# Patient Record
Sex: Male | Born: 1960
Health system: Southern US, Community
[De-identification: ages and names within clinical notes are randomized; demographics above are authoritative.]

## PROBLEM LIST (undated history)

## (undated) DIAGNOSIS — R079 Chest pain, unspecified: Secondary | ICD-10-CM

## (undated) DIAGNOSIS — K219 Gastro-esophageal reflux disease without esophagitis: Secondary | ICD-10-CM

## (undated) DIAGNOSIS — F419 Anxiety disorder, unspecified: Secondary | ICD-10-CM

## (undated) DIAGNOSIS — M19049 Primary osteoarthritis, unspecified hand: Secondary | ICD-10-CM

## (undated) DIAGNOSIS — K5790 Diverticulosis of intestine, part unspecified, without perforation or abscess without bleeding: Secondary | ICD-10-CM

## (undated) DIAGNOSIS — R42 Dizziness and giddiness: Secondary | ICD-10-CM

## (undated) DIAGNOSIS — R5383 Other fatigue: Secondary | ICD-10-CM

## (undated) DIAGNOSIS — R319 Hematuria, unspecified: Secondary | ICD-10-CM

## (undated) DIAGNOSIS — K5792 Diverticulitis of intestine, part unspecified, without perforation or abscess without bleeding: Secondary | ICD-10-CM

## (undated) DIAGNOSIS — M1990 Unspecified osteoarthritis, unspecified site: Secondary | ICD-10-CM

## (undated) DIAGNOSIS — I4891 Unspecified atrial fibrillation: Secondary | ICD-10-CM

## (undated) DIAGNOSIS — G4700 Insomnia, unspecified: Secondary | ICD-10-CM

## (undated) HISTORY — DX: Primary osteoarthritis, unspecified hand: M19.049

## (undated) HISTORY — PX: ADENOIDECTOMY: SUR15

## (undated) HISTORY — DX: Unspecified osteoarthritis, unspecified site: M19.90

## (undated) HISTORY — DX: Unspecified atrial fibrillation: I48.91

## (undated) HISTORY — PX: OTHER SURGICAL HISTORY: SHX169

## (undated) HISTORY — DX: Anxiety disorder, unspecified: F41.9

## (undated) HISTORY — DX: Gastro-esophageal reflux disease without esophagitis: K21.9

## (undated) HISTORY — DX: Insomnia, unspecified: G47.00

## (undated) HISTORY — DX: Diverticulitis of intestine, part unspecified, without perforation or abscess without bleeding: K57.92

## (undated) HISTORY — PX: ESOPHAGEAL DILATION: SHX303

## (undated) HISTORY — DX: Chest pain, unspecified: R07.9

## (undated) HISTORY — DX: Diverticulosis of intestine, part unspecified, without perforation or abscess without bleeding: K57.90

## (undated) HISTORY — DX: Other fatigue: R53.83

## (undated) HISTORY — DX: Dizziness and giddiness: R42

## (undated) HISTORY — DX: Hematuria, unspecified: R31.9

---

## 2009-12-16 HISTORY — PX: UPPER GASTROINTESTINAL ENDOSCOPY: SHX188

## 2010-04-08 ENCOUNTER — Emergency Department (HOSPITAL_COMMUNITY): Admission: EM | Admit: 2010-04-08 | Discharge: 2010-04-08 | Payer: Self-pay | Admitting: Emergency Medicine

## 2010-04-09 ENCOUNTER — Encounter (INDEPENDENT_AMBULATORY_CARE_PROVIDER_SITE_OTHER): Payer: Self-pay | Admitting: *Deleted

## 2010-04-27 ENCOUNTER — Encounter (INDEPENDENT_AMBULATORY_CARE_PROVIDER_SITE_OTHER): Payer: Self-pay | Admitting: *Deleted

## 2010-04-27 ENCOUNTER — Ambulatory Visit: Payer: Self-pay | Admitting: Gastroenterology

## 2010-04-27 DIAGNOSIS — R1319 Other dysphagia: Secondary | ICD-10-CM

## 2010-05-11 ENCOUNTER — Ambulatory Visit: Payer: Self-pay | Admitting: Gastroenterology

## 2010-05-16 ENCOUNTER — Encounter: Payer: Self-pay | Admitting: Gastroenterology

## 2010-05-24 ENCOUNTER — Telehealth: Payer: Self-pay | Admitting: Gastroenterology

## 2010-05-29 ENCOUNTER — Encounter: Payer: Self-pay | Admitting: Gastroenterology

## 2010-06-04 ENCOUNTER — Ambulatory Visit: Payer: Self-pay | Admitting: Gastroenterology

## 2010-06-04 DIAGNOSIS — K219 Gastro-esophageal reflux disease without esophagitis: Secondary | ICD-10-CM | POA: Insufficient documentation

## 2011-01-15 ENCOUNTER — Telehealth: Payer: Self-pay | Admitting: Gastroenterology

## 2011-01-15 NOTE — Assessment & Plan Note (Signed)
Summary: esophageal spasm...em   History of Present Illness Visit Type: new patient Primary GI MD: Sheryn Bison MD FACP FAGA Chief Complaint: solid food dysphagia, worse with steak.  Pt had one episode of vomiting blood after having difficulty swallowing steak. History of Present Illness:   50 year old Caucasian male referred by the emergency room for evaluation of recurrent solid food dysphagia. Patient was seen in the emergency department on April 24 because of acute dysphagia and hematemesis. Chest x-ray was unremarkable and the patient was treated with p.o. narcotics and scheduled to see GI. He currently is asymptomatic except for continued dysphagia and a large pieces of bread or meat.  [name] has had intermittent solid food dysphagia since childhood. His most recent episode of severe chest pain with dysphagia and associated hematemesis was the worst episode he has ever had. He currently is asymptomatic. He is careful about his diet but is not losing weight. He's never had any melena, had regular complaints, other medical difficulties. He has had sinusitis this past fall was briefly on antibiotics, but had no problems with oral thrush. He denies chronic reflux, history of alcohol or cigarette or NSAID abuse. He denies a lower gastrointestinal or hepatobiliary complaints. He has never had barium swallow exams or endoscopic exams.He Denies problems with chronic allergies or recurrent steroid use. He's had no unusual weight gains over the last several years.   GI Review of Systems    Reports dysphagia with solids and  vomiting blood.      Denies abdominal pain, acid reflux, belching, bloating, chest pain, dysphagia with liquids, heartburn, loss of appetite, nausea, vomiting, weight loss, and  weight gain.        Denies anal fissure, black tarry stools, change in bowel habit, constipation, diarrhea, diverticulosis, fecal incontinence, heme positive stool, hemorrhoids, irritable bowel syndrome,  jaundice, light color stool, liver problems, rectal bleeding, and  rectal pain.    Current Medications (verified): 1)  None  Allergies (verified): No Known Drug Allergies  Past History:  Past medical, surgical, family and social histories (including risk factors) reviewed for relevance to current acute and chronic problems.  Past Medical History: Unremarkable  Past Surgical History: Reviewed history from 04/25/2010 and no changes required. Knee Arthroscopy  Family History: Reviewed history and no changes required. Family History of Breast Cancer: Mother, deceased at 73yo No FH of Colon Cancer: Family History of Heart Disease: Father  Social History: Reviewed history from 04/25/2010 and no changes required.  Married, 2 girls Field seismologist Alcohol Use - yes 1 drink/day Illicit Drug Use - no Patient has never smoked.  Daily Caffeine Use 1 cup coffee  Review of Systems       The patient complains of allergy/sinus.  The patient denies anemia, anxiety-new, arthritis/joint pain, back pain, blood in urine, breast changes/lumps, confusion, cough, coughing up blood, depression-new, fainting, fatigue, fever, headaches-new, hearing problems, heart murmur, heart rhythm changes, itching, muscle pains/cramps, night sweats, nosebleeds, shortness of breath, skin rash, sleeping problems, sore throat, swelling of feet/legs, swollen lymph glands, thirst - excessive, urination - excessive, urination changes/pain, urine leakage, vision changes, and voice change.   General:  Denies fever, chills, sweats, anorexia, fatigue, weakness, malaise, weight loss, and sleep disorder. ENT:  Complains of nasal congestion and difficulty swallowing; denies earache, ear discharge, tinnitus, decreased hearing, loss of smell, nosebleeds, sore throat, and hoarseness. CV:  Denies chest pains, angina, palpitations, syncope, dyspnea on exertion, orthopnea, PND, peripheral edema, and claudication. Resp:  Denies  dyspnea at rest, dyspnea  with exercise, cough, sputum, wheezing, coughing up blood, and pleurisy. GI:  Complains of difficulty swallowing and pain on swallowing; denies nausea, indigestion/heartburn, vomiting, vomiting blood, abdominal pain, jaundice, gas/bloating, diarrhea, constipation, change in bowel habits, bloody BM's, black BMs, and fecal incontinence. GU:  Denies urinary burning, blood in urine, urinary frequency, urinary hesitancy, nocturnal urination, urinary incontinence, penile discharge, genital sores, decreased libido, and erectile dysfunction. MS:  Denies joint pain / LOM, joint swelling, joint stiffness, joint deformity, low back pain, muscle weakness, muscle cramps, muscle atrophy, leg pain at night, leg pain with exertion, and shoulder pain / LOM hand / wrist pain (CTS). Derm:  Denies rash, itching, dry skin, hives, moles, warts, and unhealing ulcers. Neuro:  Denies weakness, paralysis, abnormal sensation, seizures, syncope, tremors, vertigo, transient blindness, frequent falls, frequent headaches, difficulty walking, headache, sciatica, radiculopathy other:, restless legs, memory loss, and confusion. Psych:  Denies depression, anxiety, memory loss, suicidal ideation, hallucinations, paranoia, phobia, and confusion. Endo:  Denies cold intolerance, heat intolerance, polydipsia, polyphagia, polyuria, unusual weight change, and hirsutism. Heme:  Denies bruising, bleeding, enlarged lymph nodes, and pagophagia. Allergy:  Complains of hay fever; denies hives, rash, sneezing, and recurrent infections.  Vital Signs:  Patient profile:   50 year old male Height:      78 inches Weight:      220 pounds BMI:     25.52 Pulse rate:   64 / minute Pulse rhythm:   regular BP sitting:   120 / 80  (left arm) Cuff size:   regular  Vitals Entered By: Francee Piccolo CMA Duncan Dull) (Apr 27, 2010 10:07 AM)  Physical Exam  General:  Well developed, well nourished, no acute distress.healthy  appearing.  Tall healthy-appearing patient measures 6 feet 6 inches in height. There no physical exam characteristics of Marfan's syndrome. Head:  Normocephalic and atraumatic. Eyes:  PERRLA, no icterus.exam deferred to patient's ophthalmologist.   Neck:  Supple; no masses or thyromegaly. Lungs:  Clear throughout to auscultation. Heart:  Regular rate and rhythm; no murmurs, rubs,  or bruits. Abdomen:  Soft, nontender and nondistended. No masses, hepatosplenomegaly or hernias noted. Normal bowel sounds. Msk:  Symmetrical with no gross deformities. Normal posture. Pulses:  Normal pulses noted. Extremities:  No clubbing, cyanosis, edema or deformities noted. Neurologic:  Alert and  oriented x4;  grossly normal neurologically. Cervical Nodes:  No significant cervical adenopathy. Psych:  Alert and cooperative. Normal mood and affect.   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 He may well have an element of chronic acid reflux, but he denies reflux symptomatology. The opposite had recent esophageal tear and severe esophagitis associated with a meat impaction. He currently is asymptomatic his physical exam is normal. He is at high risk for future meat impactions and esophageal tear and perforation. I have explained to him in detail the need for endoscopy and esophageal dilatation. He understands the risk and benefits and has agreed to proceed. In the interim, I placed him on step 3 dysphagia diet. His health otherwise is excellent and he should tolerate this procedure well. I also explained to him that if he does have a Schatzki's ring, he may need aggressive intermittent dilations.  Patient Instructions: 1)  You are scheduled for an upper endoscopy. 2)  Upper Endoscopy brochure given.  3)  The medication list was reviewed and reconciled.  All  changed /  newly prescribed medications were explained.  A complete medication list was provided to the patient / caregiver. 4)  Copy sent to : Dr R. Aava 5)  Dysphagia diet provided.  6)  Conscious Sedation brochure given.  7)  Upper Endoscopy with Dilatation brochure given.   Appended Document: esophageal spasm...em    Clinical Lists Changes  Orders: Added new Test order of EGD (EGD) - Signed

## 2011-01-15 NOTE — Miscellaneous (Signed)
Summary: Aciphex Prior Authorization   Case ID: [id number] Member Number: [id number] Case Type: Initial Review Case Start Date: 05/29/2010 Case Status: Coverage has been APPROVED. You will receive a confirmation letter confirming approval of this medication. The patient will also be notified of this approval via an automated outbound phone call or a letter. Please allow approximately 2 hours to update our system with the approval. Once updated, the prescription can be re-submitted.   Coverage Start Date: 05/08/2010 Coverage End Date: 05/28/2012  Patient First Name: Dagoberto Patient Last Name: HETZER DOB: [birth year] Patient Street Address: [address]   Patient City: [place] Patient State: [place] Patient Zip: [phone]  Drug Name & Strength: Dexilant 60 Mg  Clinical Lists Changes

## 2011-01-15 NOTE — Letter (Signed)
Summary: New Patient letter  [facility]  [address] [place], [place] [address]   Phone: [phone]  Fax: [phone]       04/09/2010 MRN: [id number]  Joseph Brennan  Dear Joseph Brennan,  Welcome to the Gastroenterology Division at [facility].    You are scheduled to see Dr.  Sheryn Bison on Apr 27, 2010 at 10:00am on the 3rd floor at [facility], [address]  We ask that you try to arrive at our office 15 minutes prior to your appointment time to allow for check-in.  We would like you to complete the enclosed self-administered evaluation form prior to your visit and bring it with you on the day of your appointment.  We will review it with you.  Also, please bring a complete list of all your medications or, if you prefer, bring the medication bottles and we will list them.  Please bring your insurance card so that we may make a copy of it.  If your insurance requires a referral to see a specialist, please bring your referral form from your primary care physician.  Co-payments are due at the time of your visit and may be paid by cash, check or credit card.     Your office visit will consist of a consult with your physician (includes a physical exam), any laboratory testing he/she may order, scheduling of any necessary diagnostic testing (e.g. x-ray, ultrasound, CT-scan), and scheduling of a procedure (e.g. Endoscopy, Colonoscopy) if required.  Please allow enough time on your schedule to allow for any/all of these possibilities.    If you cannot keep your appointment, please call [phone] to cancel or reschedule prior to your appointment date.  This allows [place] the opportunity to schedule an appointment for another patient in need of care.  If you do not cancel or reschedule by 5 p.m. the business day prior to your appointment date, you will be charged a $50.00 late cancellation/no-show fee.    Thank you for  choosing [facility] for your medical needs.  We appreciate the opportunity to care for you.  Please visit [place] at our website  to learn more about our practice.                     Sincerely,                                                             The Gastroenterology Division

## 2011-01-15 NOTE — Medication Information (Signed)
Summary: Dexilant Approved/[employer]  Dexilant Approved/[employer]   Imported By: Sherian Rein 05/31/2010 14:00:16  _____________________________________________________________________  External Attachment:    Type:   Image     Comment:   External Document

## 2011-01-15 NOTE — Progress Notes (Signed)
Summary: Dexilant  Phone Note Call from Patient Call back at [facility] Phone [phone]   Caller: Patient Call For: Dr. Jarold Motto Reason for Call: Refill Medication Summary of Call: would like a refill of Dexilant... [facility] [place] Initial call taken by: Vallarie Mare,  May 24, 2010 2:23 PM  Follow-up for Phone Call        Rx sent as requested. Follow-up by: Ashok Cordia RN,  May 24, 2010 2:31 PM    New/Updated Medications: DEXILANT 60 MG CPDR (DEXLANSOPRAZOLE) 1 by mouth q am Prescriptions: DEXILANT 60 MG CPDR (DEXLANSOPRAZOLE) 1 by mouth q am  #30 x 11   Entered by:   Ashok Cordia RN   Authorized by:   Mardella Layman MD [facility]   Signed by:   Ashok Cordia RN on 05/24/2010   Method used:   [facility]  [place] [address] (retail)       [address]       [place], [place]  [address]       Ph: [phone] or [phone]       Fax: [phone]   RxID:   [id number]

## 2011-01-15 NOTE — Letter (Signed)
Summary: Patient Notice-Endo Biopsy Results  [facility]  [address] [place], [place] [address]   Phone: [phone]  Fax: [phone]        May 16, 2010 MRN: [id number]    Joseph Brennan [address] [place], [place]  [address]    Dear [name],  I am pleased to inform you that the biopsies taken during your recent endoscopic examination did not show any evidence of cancer upon pathologic examination.  Additional information/recommendations:  __No further action is needed at this time.  Please follow-up with      your primary care physician for your other healthcare needs.  __ Please call [phone] to schedule a return visit to review      your condition.  _x_ Continue with the treatment plan as outlined on the day of your      exam.  __ You should have a repeat endoscopic examination for this problem              in _ months/years.   Please call us if you are having persistent problems or have questions about your condition that have not been fully answered at this time.  Sincerely,  Mardella Layman MD [facility]  This letter has been electronically signed by your physician.  Appended Document: Patient Notice-Endo Biopsy Results letter mailed.

## 2011-01-15 NOTE — Letter (Signed)
Summary: EGD Instructions  [facility]  [address] [place], [place] [address]   Phone: [phone]  Fax: [phone]       Joseph Brennan    13-Jun-1961    MRN: [id number]       Procedure Day /Date: Friday, 05/11/10     Arrival Time:  1:30     Procedure Time: 2:30     [facility] of Procedure:                    [name]th [facility]    PREPARATION FOR ENDOSCOPY   On 05/11/10 THE DAY OF THE PROCEDURE:  1.   No solid foods, milk or milk products are allowed after midnight the night before your procedure.  2.   Do not drink anything colored red or purple.  Avoid juices with pulp.  No orange juice.  3.  You may drink clear liquids until 12:30, which is 2 hours before your procedure.                                                                                                CLEAR LIQUIDS INCLUDE: Water Jello Ice Popsicles Tea (sugar ok, no milk/cream) Powdered fruit flavored drinks Coffee (sugar ok, no milk/cream) Gatorade Juice: apple, white grape, white cranberry  Lemonade Clear bullion, consomm, broth Carbonated beverages (any kind) Strained chicken noodle soup Hard Candy   MEDICATION INSTRUCTIONS  Unless otherwise instructed, you should take regular prescription medications with a small sip of water as early as possible the morning of your procedure.                    OTHER INSTRUCTIONS  You will need a responsible adult at least 48 years of age to accompany you and drive you home.   This person must remain in the waiting room during your procedure.  Wear loose fitting clothing that is easily removed.  Leave jewelry and other valuables at home.  However, you may wish to bring a book to read or an iPod/MP3 player to listen to music as you wait for your procedure to start.  Remove all body piercing jewelry and leave at home.  Total time from sign-in until discharge is approximately 2-3 hours.  You should go  home directly after your procedure and rest.  You can resume normal activities the day after your procedure.  The day of your procedure you should not:   Drive   Make legal decisions   Operate machinery   Drink alcohol   Return to work  You will receive specific instructions about eating, activities and medications before you leave.    The above instructions have been reviewed and explained to me by   _______________________    I fully understand and can verbalize these instructions _____________________________ Date _________

## 2011-01-15 NOTE — Procedures (Signed)
Summary: Upper Endoscopy  Patient: Joseph Brennan Note: All result statuses are Final unless otherwise noted.  Tests: (1) Upper Endoscopy (EGD)   EGD Upper Endoscopy       DONE     [facility]     [address]     [place], [place]  [address]           ENDOSCOPY PROCEDURE REPORT           PATIENT:  Joseph Brennan, Joseph Brennan  MR#:  [id number]     BIRTHDATE:  05-01-1961, 48 yrs. old  GENDER:  male           ENDOSCOPIST:  Vania Rea. Jarold Motto, MD, [facility]     Referred by:           PROCEDURE DATE:  05/11/2010     PROCEDURE:  EGD with biopsy     ASA CLASS:  Class I     INDICATIONS:  dysphagia           MEDICATIONS:   Fentanyl 75 mcg IV, Versed 7 mg IV     TOPICAL ANESTHETIC:  Exactacain Spray           DESCRIPTION OF PROCEDURE:   After the risks benefits and     alternatives of the procedure were thoroughly explained, informed     consent was obtained.  The [facility] [id number] endoscope was     introduced through the mouth and advanced to the second portion of     the duodenum, without limitations.  The instrument was slowly     withdrawn as the mucosa was fully examined.     <<PROCEDUREIMAGES>>           A stricture was found at the gastroesophageal junction. EROSIONS     AND TIGHT STRICTURE DILATED WITH THE SCOPE.BIOPSIES DONE.  The     duodenal bulb was normal in appearance, as was the postbulbar     duodenum.  The stomach was entered and closely examined. The     antrum, angularis, and lesser curvature were well visualized,     including a retroflexed view of the cardia and fundus. The stomach     wall was normally distensable. The scope passed easily through the     pylorus into the duodenum.    SOME HEME NOTED.  The scope was then     withdrawn from the patient and the procedure completed.           COMPLICATIONS:  None           ENDOSCOPIC IMPRESSION:     1) Stricture at the gastroesophageal junction     2) Normal duodenum     3) Normal stomach     CHRONIC GERD AND PEPTIC  STRICTURE DILATED WITH ENDOSCOPE.R/O     EOSINOPHILIC ESOPHAGITIS.     RECOMMENDATIONS:     1) Await biopsy results     2) Clear liquids until, then soft foods rest iof day. Resume     prior diet tomorrow.     3) dilatations PRN     4) post dilation instructions     DEXILANT SAMPLES QAM.SEE ME 2 WEEKS.           REPEAT EXAM:  No           ______________________________     Vania Rea. Jarold Motto, MD, [facility]           CC:  Chilton Greathouse, MD  n.     eSIGNED:   Vania Rea. Patterson at 05/11/2010 02:42 PM           Nevin Bloodgood, [phone]  Note: An exclamation mark (!) indicates a result that was not dispersed into the flowsheet. Document Creation Date: 05/11/2010 2:43 PM _______________________________________________________________________  (1) Order result status: Final Collection or observation date-time: 05/11/2010 14:33 Requested date-time:  Receipt date-time:  Reported date-time:  Referring Physician:   Ordering Physician: Sheryn Bison [phone]) Specimen Source:  Source: Launa Grill Order Number: [id number] Lab site:   Appended Document: Upper Endoscopy noted

## 2011-01-15 NOTE — Assessment & Plan Note (Signed)
Summary: EGD F/U.Marland KitchenMarland KitchenAS.   History of Present Illness Visit Type: Follow-up Visit Primary GI MD: Sheryn Bison MD FACP FAGA Primary Provider: Lilli Few, MD Chief Complaint: No dysphagia or espohageal spasms History of Present Illness:   The Patient is 100% better on daily PPI therapy. In one endoscopy with esophageal dilatation on May 27. There was severe esophagitis with tight stricture that was dilated with the endoscope. Biopsies showed inflammation but no intestinal metaplasia. He does have allergies but is not on allergy shots her immunosuppressants.   GI Review of Systems      Denies abdominal pain, acid reflux, belching, bloating, chest pain, dysphagia with liquids, dysphagia with solids, heartburn, loss of appetite, nausea, vomiting, vomiting blood, weight loss, and  weight gain.        Denies anal fissure, black tarry stools, change in bowel habit, constipation, diarrhea, diverticulosis, fecal incontinence, heme positive stool, hemorrhoids, irritable bowel syndrome, jaundice, light color stool, liver problems, rectal bleeding, and  rectal pain.    Current Medications (verified): 1)  Dexilant 60 Mg Cpdr (Dexlansoprazole) .Marland Kitchen.. 1 By Mouth Q Am 2)  Multivitamins  Tabs (Multiple Vitamin) .... Once Daily  Allergies (verified): No Known Drug Allergies  Past History:  Past medical, surgical, family and social histories (including risk factors) reviewed for relevance to current acute and chronic problems.  Past Medical History: Reviewed history from 04/27/2010 and no changes required. Unremarkable  Past Surgical History: Knee Arthr0scopy Adnoidecotmy  Family History: Reviewed history from 04/27/2010 and no changes required. Family History of Breast Cancer: Mother, deceased at 47yo No FH of Colon Cancer: Family History of Heart Disease: Father  Social History: Reviewed history from 04/27/2010 and no changes required.  Married, 2 girls Field seismologist Alcohol Use -  yes 1 drink/day Illicit Drug Use - no Patient has never smoked.  Daily Caffeine Use 1 cup coffee  Review of Systems       The patient complains of arthritis/joint pain and back pain.  The patient denies allergy/sinus, anemia, anxiety-new, blood in urine, breast changes/lumps, change in vision, confusion, cough, coughing up blood, depression-new, fainting, fatigue, fever, headaches-new, hearing problems, heart murmur, heart rhythm changes, itching, menstrual pain, muscle pains/cramps, night sweats, nosebleeds, pregnancy symptoms, shortness of breath, skin rash, sleeping problems, sore throat, swelling of feet/legs, swollen lymph glands, thirst - excessive, urination - excessive, urination changes/pain, urine leakage, vision changes, and voice change.         He has had some arthralgias and stiffness in his hands but no other allergic symptomatology, nausea, abdominal pain, or diarrhea from PPI therapy.  Vital Signs:  Patient profile:   50 year old male Height:      78 inches Weight:      222.38 pounds BMI:     25.79 Pulse rate:   68 / minute Pulse rhythm:   regular BP sitting:   110 / 86  (left arm) Cuff size:   regular  Vitals Entered By: June McMurray CMA Duncan Dull) (June 04, 2010 11:32 AM)  Physical Exam  General:  Well developed, well nourished, no acute distress.healthy appearing.  healthy appearing.   Head:  Normocephalic and atraumatic. Eyes:  PERRLA, no icterus.exam deferred to patient's ophthalmologist.  exam deferred to patient's ophthalmologist.   Psych:  Alert and cooperative. Normal mood and affect.   Impression & Recommendations:  Problem # 1:  DYSPHAGIA (ICD-787.29) Assessment Improved Continue daily PPI therapy---will change to Nexium 40 mg a day per his insurance plan. Anti-reflux maneuvers reviewed, and I  will see him again in 6 months time. He may need further dilations depending on his symptomatology. We reviewed the class of medications and multiple PPI profiles,  and actually is questioned at length. He seemed very satisfied with his care, and is able to swallow things that he never good in the past. He has recurrent problems we will consider esophageal manometry and 24-hour pH probe testing.  Problem # 2:  GERD (ICD-530.81) Assessment: Improved  Patient Instructions: 1)  Stop Dexilant. 2)  Begin Nexium 40 mg once daily. 3)  Please schedule a follow-up appointment in 6 months. 4)  The medication list was reviewed and reconciled.  All changed / newly prescribed medications were explained.  A complete medication list was provided to the patient / caregiver. 5)  Please schedule a follow-up appointment in 6 months. 6)  Please schedule a follow-up appointment as needed.  7)  Avoid foods high in acid content ( tomatoes, citrus juices, spicy foods) . Avoid eating within 3 to 4 hours of lying down or before exercising. Do not over eat; try smaller more frequent meals. Elevate head of bed four inches when sleeping.  8)  Copy sent to : Dr. Dennison Mascot internal medicine. Prescriptions: NEXIUM 40 MG  CPDR (ESOMEPRAZOLE MAGNESIUM) 1 capsule each day 30 minutes before meal  #30 x 11   Entered by:   Ashok Cordia RN   Authorized by:   Mardella Layman MD [facility]   Signed by:   Ashok Cordia RN on 06/04/2010   Method used:   Print then Give to Patient   RxID:   [id number]

## 2011-01-23 NOTE — Progress Notes (Signed)
Summary: Medication  Phone Note Call from Patient Call back at Home Phone [phone]   Caller: Patient Call For: Dr. Jarold Motto Reason for Call: Refill Medication Summary of Call: Needs a refill on his Dexilant fax to [facility] [phone] Initial call taken by: Karna Christmas,  January 15, 2011 3:56 PM  Follow-up for Phone Call        sent to [facility]. Follow-up by: Harlow Mares CMA (AAMA),  January 15, 2011 4:02 PM    Prescriptions: NEXIUM 40 MG  CPDR (ESOMEPRAZOLE MAGNESIUM) 1 capsule each day 30 minutes before meal  #90 x 3   Entered by:   Harlow Mares CMA (AAMA)   Authorized by:   Mardella Layman MD [facility]   Signed by:   Harlow Mares CMA (AAMA) on 01/15/2011   Method used:   Electronically to        [facility]* (retail)             ,          Ph: [phone]       Fax: [phone]   RxID:   [id number]

## 2011-12-05 ENCOUNTER — Other Ambulatory Visit: Payer: Self-pay | Admitting: Gastroenterology

## 2012-04-15 ENCOUNTER — Ambulatory Visit: Payer: BC Managed Care – PPO

## 2012-04-15 ENCOUNTER — Ambulatory Visit (INDEPENDENT_AMBULATORY_CARE_PROVIDER_SITE_OTHER): Payer: BC Managed Care – PPO | Admitting: Family Medicine

## 2012-04-15 VITALS — BP 118/78 | HR 62 | Temp 98.1°F | Resp 16 | Ht 78.5 in | Wt 235.0 lb

## 2012-04-15 DIAGNOSIS — S63006A Unspecified dislocation of unspecified wrist and hand, initial encounter: Secondary | ICD-10-CM

## 2012-04-15 DIAGNOSIS — S61219A Laceration without foreign body of unspecified finger without damage to nail, initial encounter: Secondary | ICD-10-CM

## 2012-04-15 DIAGNOSIS — M25539 Pain in unspecified wrist: Secondary | ICD-10-CM

## 2012-04-15 DIAGNOSIS — Z23 Encounter for immunization: Secondary | ICD-10-CM

## 2012-04-15 MED ORDER — TRAMADOL HCL 50 MG PO TABS: 50.0000 mg | ORAL_TABLET | Freq: Three times a day (TID) | ORAL | Status: AC | PRN

## 2012-04-15 MED ORDER — TETANUS-DIPHTH-ACELL PERTUSSIS 5-2.5-18.5 LF-MCG/0.5 IM SUSP
0.5000 mL | Freq: Once | INTRAMUSCULAR | Status: AC
  Administered 2012-04-15: 0.5 mL via INTRAMUSCULAR

## 2012-04-15 NOTE — Progress Notes (Signed)
  [facility]:  Office Visit  Chief Complaint:  Chief Complaint  Patient presents with  . finger cut    right pinky cut with knife around 11 lastnight    HPI: Joseph Brennan is a 51 y.o. male who complains of  Right 5th finger cut s/p cleaning knife. Patient is a Engineer, civil (consulting) and was cleaning a large non-serrated Japanese knife when it slid and cut his finger. Not UTD on tetanus. No fevers, chills, redness, pus.  No tendon exposure. He is a Firefighter. He did not take anything for pain. He is not on any blood thinners. He is left handed.Deneis weakness, numbness, or tingling.    Past Medical History  Diagnosis Date  . GERD (gastroesophageal reflux disease)    Past Surgical History  Procedure Date  . Esophageal dilation   . Right knee arthroscopy     Dr. Rennis Chris for mensicus injury   History   Social History  . Marital Status: Married    Spouse Name: N/A    Number of Children: N/A  . Years of Education: N/A   Social History Main Topics  . Smoking status: Never Smoker   . Smokeless tobacco: None  . Alcohol Use: 0.0 oz/week     social  . Drug Use: No  . Sexually Active: None   Other Topics Concern  . None   Social History Narrative  . None   No family history on file. No Known Allergies Prior to Admission medications   Medication Sig Start Date End Date Taking? Authorizing Provider  omeprazole (PRILOSEC) 20 MG capsule Take 20 mg by mouth daily.   Yes Historical Provider, MD     ROS: The patient denies fevers, chills, night sweats, unintentional weight loss, chest pain, palpitations, wheezing, dyspnea on exertion, nausea, vomiting, abdominal pain, dysuria, hematuria, melena, numbness, weakness, or tingling.   All other systems have been reviewed and were otherwise negative with the exception of those mentioned in the HPI and as above.    PHYSICAL EXAM: Filed Vitals:   04/15/12 1030  BP: 118/78  Pulse: 62  Temp: 98.1 F (36.7 C)    Resp: 16   Filed Vitals:   04/15/12 1030  Height: 6' 6.5" (1.994 m)  Weight: 235 lb (106.595 kg)   Body mass index is 26.81 kg/(m^2).  General: Alert, no acute distress HEENT:  Normocephalic, atraumatic, oropharynx patent.  Cardiovascular:  Regular rate and rhythm, no rubs murmurs or gallops.  No Carotid bruits, radial pulse intact. No pedal edema.  Respiratory: Clear to auscultation bilaterally.  No wheezes, rales, or rhonchi.  No cyanosis, no use of accessory musculature GI: No organomegaly, abdomen is soft and non-tender, positive bowel sounds.  No masses. Skin: No rashes. Neurologic: Facial musculature symmetric. Psychiatric: Patient is appropriate throughout our interaction. Lymphatic: No cervical lymphadenopathy Musculoskeletal: Gait intact. Right Hand: No swelling, + tender 1 cm laceration ( horizontal ) + DIP,PIP intact + radial pulse + cap refill + neurovascularly intact 5/5 strength Sensation intact   LABS: No results found for this or any previous visit.   EKG/XRAY:   Primary read interpreted by Dr. Conley Rolls at [facility]. No foreign body No fx/dislocation   ASSESSMENT/PLAN: Encounter Diagnosis  Name Primary?  . Finger laceration Yes   1. Stitches 2. Wound care as directed 3. Tramadol rx prn pain    Decarla Siemen PHUONG, DO 04/15/2012 10:52 AM

## 2012-04-15 NOTE — Progress Notes (Signed)
  Subjective:    Patient ID: Joseph Brennan, male    DOB: 04-18-1961, 51 y.o.   MRN: [id number]  HPI    Review of Systems     Objective:   Physical Exam   Procedure Note:  VCO.  Digital block placed in left 5th digit finger.  Wound soaked in soapy water and thoroughly cleansed to remove superficial debris.  #4 simple interrupted sutures place to close wound.  Fingertip and coban dressing applied.  Pt tolerated well.     Assessment & Plan:  Left 5th fingertip laceration: Tdap updated today.  Wound instructions given with pamphlet.  RTC in 7 days for suture removal.

## 2012-04-21 ENCOUNTER — Telehealth: Payer: Self-pay

## 2012-04-21 ENCOUNTER — Ambulatory Visit (INDEPENDENT_AMBULATORY_CARE_PROVIDER_SITE_OTHER): Payer: BC Managed Care – PPO | Admitting: Physician Assistant

## 2012-04-21 DIAGNOSIS — Z4802 Encounter for removal of sutures: Secondary | ICD-10-CM

## 2012-04-21 DIAGNOSIS — L089 Local infection of the skin and subcutaneous tissue, unspecified: Secondary | ICD-10-CM

## 2012-04-21 DIAGNOSIS — T148XXA Other injury of unspecified body region, initial encounter: Secondary | ICD-10-CM

## 2012-04-21 MED ORDER — DOXYCYCLINE HYCLATE 100 MG PO CAPS: 100.0000 mg | ORAL_CAPSULE | Freq: Two times a day (BID) | ORAL | Status: AC

## 2012-04-21 NOTE — Progress Notes (Signed)
   Patient ID: Joseph Brennan MRN: [id number], DOB: 1961/03/31 51 y.o. Date of Encounter: 04/21/2012, 11:14 AM  Primary Physician: No primary provider on file.  Chief Complaint: Suture removal    See note from 04/15/12   HPI: 51 y.o. y/o male with injury to right 5th digit. Here for suture removal s/p placement on 04/15/12. Doing well Noticed mild erythema and drainage 2 days prior after he hit the wound on a counter top. Drainage has been mostly clear Afebrile/ No chills No pain Able to move without difficulty Normal sensation  Past Medical History  Diagnosis Date  . GERD (gastroesophageal reflux disease)      Home Meds: Prior to Admission medications   Medication Sig Start Date End Date Taking? Authorizing Provider  omeprazole (PRILOSEC) 20 MG capsule Take 20 mg by mouth daily.   Yes Historical Provider, MD  traMADol (ULTRAM) 50 MG tablet Take 1 tablet (50 mg total) by mouth every 8 (eight) hours as needed for pain. 04/15/12 04/25/12  Thao P Le, DO    Allergies: No Known Allergies  Physical Exam: Blood pressure 135/83, pulse 60, temperature 97.6 F (36.4 C), temperature source Oral, resp. rate 18, height 6\' 6"  (1.981 m), weight 234 lb (106.142 kg)., Body mass index is 27.04 kg/(m^2). General: Well developed, well nourished, in no acute distress. Head: Normocephalic, atraumatic, sclera non-icteric, no xanthomas, nares are without discharge.  Neck: Supple. Lungs: Breathing is unlabored. Heart: Normal rate. Msk:  Strength and tone appear normal for age. Wound: Right 5th digit wound slightly macerated tissue with some local STS and erythema. No purulent discharge. Wound edges are approximated. No tenderness to palpation. FROM and 5/5 strength with normal sensation throughout including 2 point discrimination Skin: See above, otherwise dry without rash. Extremities: No clubbing or cyanosis. No edema. Neuro: Alert and oriented X 3. Moves all extremities spontaneously.  Psych:   Responds to questions appropriately with a normal affect.   PROCEDURE: Verbal consent obtained. 4 sutures removed without difficulty.  Assessment and Plan: 51 y.o. y/o male here for suture removal for wound described above and wound infection. -Sutures removed per above -Doxycycline 100 mg 1 po bid 320 no RF -RTC precautions -RTC prn  Signed, Eula Listen, PA-C 04/21/2012 11:14 AM

## 2012-04-21 NOTE — Telephone Encounter (Signed)
Spoke with patient advised him to return to clinic to recheck stitches and infection patient agrees

## 2012-04-21 NOTE — Telephone Encounter (Signed)
.  umfc The patient called to request antibiotic be called in to [facility] because he believes his finger laceration has become infected.  Please call patient at [phone].

## 2013-09-03 ENCOUNTER — Ambulatory Visit: Payer: BC Managed Care – PPO | Admitting: Internal Medicine

## 2013-09-03 ENCOUNTER — Telehealth: Payer: Self-pay

## 2013-09-03 VITALS — BP 126/80 | HR 64 | Temp 98.5°F | Resp 16 | Ht 78.0 in | Wt 242.0 lb

## 2013-09-03 DIAGNOSIS — S81009A Unspecified open wound, unspecified knee, initial encounter: Secondary | ICD-10-CM

## 2013-09-03 DIAGNOSIS — M79605 Pain in left leg: Secondary | ICD-10-CM

## 2013-09-03 DIAGNOSIS — M79609 Pain in unspecified limb: Secondary | ICD-10-CM

## 2013-09-03 DIAGNOSIS — T148XXA Other injury of unspecified body region, initial encounter: Secondary | ICD-10-CM

## 2013-09-03 DIAGNOSIS — S81802A Unspecified open wound, left lower leg, initial encounter: Secondary | ICD-10-CM

## 2013-09-03 DIAGNOSIS — W540XXA Bitten by dog, initial encounter: Secondary | ICD-10-CM

## 2013-09-03 MED ORDER — AMOXICILLIN-POT CLAVULANATE 875-125 MG PO TABS: 1.0000 | ORAL_TABLET | Freq: Two times a day (BID) | ORAL | Status: DC

## 2013-09-03 NOTE — Telephone Encounter (Signed)
Patient was seen today (09/03/2013) for a dog bite. States that he is supposed to get a series of shots to follow up after being treated for the bite. However, he needs to know where he could go to have this done. Patient has already called the [facility] and they directed him to go to the ER. Patient would like another option. [phone].

## 2013-09-03 NOTE — Progress Notes (Signed)
  Subjective:    Patient ID: Joseph Brennan, male    DOB: 15-Sep-1961, 52 y.o.   MRN: [id number]  HPI On [address] and neighbors dog bit his leg. Minimal information given. Has wound on left calf, utd on Td. Wound needs cleaning and repair. Animal control notified by [facility].   Review of Systems healthy    Objective:   Physical Exam  Vitals reviewed. Constitutional: He is oriented to person, place, and time. He appears well-developed and well-nourished. No distress.  HENT:  Head: Normocephalic.  Eyes: EOM are normal.  Pulmonary/Chest: Effort normal.  Musculoskeletal: Normal range of motion. He exhibits tenderness.  Neurological: He is alert and oriented to person, place, and time. He has normal reflexes. No cranial nerve deficit. Coordination normal.  Skin: Abrasion, bruising, ecchymosis and laceration noted. There is erythema.     Dog bite wound  Psychiatric: He has a normal mood and affect.   Ms Leotis Shames to clean and repair wound       Assessment & Plan:  Animal control to investigate bite and animal

## 2013-09-03 NOTE — Progress Notes (Signed)
  Subjective:    Patient ID: Joseph Brennan, male    DOB: 1961/03/08, 52 y.o.   MRN: [id number]  HPI pt here c/o of a dog bite on left calf x today, now pain bleeding under control.     Review of Systems     Objective:   Physical Exam        Assessment & Plan:

## 2013-09-03 NOTE — Patient Instructions (Addendum)
Rabies  Rabies is a viral infection that can be spread to people from infected animals. The infection affects the brain and central nervous system. Once the disease develops, it almost always causes death. Because of this, when a person is bitten by an animal that may have rabies, treatment to prevent rabies often needs to be started whether or not the animal is known to be infected. Prompt treatment with the rabies vaccine and rabies immune globulin is very effective at preventing the infection from developing in people who have been exposed to the rabies virus. CAUSES  Rabies is caused by a virus that lives inside some animals. When a person is bitten by an infected animal, the rabies virus is spread to the person through the infected spit (saliva) of the animal. This virus can be carried by animals such as dogs, cats, skunks, bats, woodchucks, raccoons, coyotes, and foxes. SYMPTOMS  By the time symptoms appear, rabies is usually fatal for the person. Common symptoms include:  Headache.  Fever.  Fatigue and weakness.  Agitation.  Anxiety.  Confusion.  Unusual behavior, such as hyperactivity, fear of water (hydrophobia), or fear of air (aerophobia).  Hallucinations.  Insomnia.  Weakness in the arms or legs.  Difficulty swallowing. Most people get sick in 1 3 months after being bitten. This often varies and may depend on the location of the bite. The infection will take less time to develop if the bite occurred closer to the head.  DIAGNOSIS  To determine if a person is infected, several tests must be performed, such as:  A skin biopsy.  A saliva test.  A lumbar puncture to remove spinal fluid so it can be examined.  Blood tests. TREATMENT  Treatment to prevent the infection from developing (post-exposure prophylaxis, PEP) is often started before knowing for sure if the person has been exposed to the rabies virus. PEP involves cleaning the wound, giving an antibody injection  (rabies immune globulin), and giving a series of rabies vaccine injections. The series of injections are usually given over a two-week period. If possible, the animal that bit the person will be observed to see if it remains healthy. If the animal has been killed, it can be sent to a state laboratory and examined to see if the animal had rabies. If a person is bitten by a domestic animal (dog, cat, or ferret) that appears healthy and can be observed to see if it remains healthy, often no further treatment is necessary other than care of the wounds caused by the animal. Rabies is often a fatal illness once the infection develops in a person. Although a few people who developed rabies have survived after experimental treatment with certain drugs, all these survivors still had severe nervous system problems after the treatment. This is why caregivers use extra caution and begin PEP treatment for people who have been bitten by animals that are possibly infected with rabies.  HOME CARE INSTRUCTIONS  If you were bitten by an unknown animal, make sure you know your caregiver's instructions for follow-up. If the animal was sent to a laboratory for examination, ask when the test results will be ready. Make sure you get the test results.  Take these steps to care for your wound:  Keep the wound clean, dry, and dressed as directed by your caregiver.  Keep the injured part elevated as much as possible.  Do not resume use of the affected area until directed.  Only take over-the-counter or prescription medicines as directed   by your caregiver.  Keep all follow-up appointments as directed by your caregiver. PREVENTION  To prevent rabies, people need to reduce their risk of having contact with infected animals.   Make sure your pets (dogs, cats, ferrets) are vaccinated against rabies. Keep these vaccinations up-to-date as directed by your veterinarian.  Supervise your pets when they are outside. Keep them away  from wild animals.  Call your local animal control services to report any stray animals. These animals may not be vaccinated.  Stay away from stray or wild animals.  Consider getting the rabies vaccine (preexposure) if you are traveling to an area where rabies is common or if your job or activities involve possible contact with wild or stray animals. Discuss this with your caregiver. Document Released: 12/02/2005 Document Revised: 08/26/2012 Document Reviewed: 06/30/2012 Exit[employer] Patient Information 2014 Exit[employer], [place].   WOUND CARE Please return in 7-10 days to have your stitches/staples removed or sooner if you have concerns. Marland Kitchen Keep area clean and dry for 24 hours. Do not remove bandage, if applied. . After 24 hours, remove bandage and wash wound gently with mild soap and warm water. Reapply a new bandage after cleaning wound, if directed. . Continue daily cleansing with soap and water until stitches/staples are removed. . Do not apply any ointments or creams to the wound while stitches/staples are in place, as this may cause delayed healing. . Notify the office if you experience any of the following signs of infection: Swelling, redness, pus drainage, streaking, fever >101.0 F . Notify the office if you experience excessive bleeding that does not stop after 15-20 minutes of constant, firm pressure.

## 2013-09-03 NOTE — Telephone Encounter (Signed)
Spoke with pt advised that the [facility] ED is the only place to receive the rabies shots and immunoglobin. Pt understood.

## 2013-09-03 NOTE — Progress Notes (Signed)
Verbal consent obtained from the patient.  Local anesthesia with 2cc Lidocaine 2% without epinephrine.  Wound scrubbed with soap and water and rinsed.  Wound closed with #2 4-0 Prolene (#1 HM, #1 SI) sutures.  Wound cleansed and dressed.

## 2013-09-06 ENCOUNTER — Emergency Department (HOSPITAL_COMMUNITY)
Admission: EM | Admit: 2013-09-06 | Discharge: 2013-09-06 | Disposition: A | Discharge status: Home / Self Care | Payer: BC Managed Care – PPO | Attending: Emergency Medicine | Admitting: Emergency Medicine

## 2013-09-06 ENCOUNTER — Encounter (HOSPITAL_COMMUNITY): Payer: Self-pay | Admitting: *Deleted

## 2013-09-06 DIAGNOSIS — Z792 Long term (current) use of antibiotics: Secondary | ICD-10-CM | POA: Insufficient documentation

## 2013-09-06 DIAGNOSIS — Z79899 Other long term (current) drug therapy: Secondary | ICD-10-CM | POA: Insufficient documentation

## 2013-09-06 DIAGNOSIS — Z203 Contact with and (suspected) exposure to rabies: Secondary | ICD-10-CM | POA: Insufficient documentation

## 2013-09-06 DIAGNOSIS — Z23 Encounter for immunization: Secondary | ICD-10-CM | POA: Insufficient documentation

## 2013-09-06 DIAGNOSIS — K219 Gastro-esophageal reflux disease without esophagitis: Secondary | ICD-10-CM | POA: Insufficient documentation

## 2013-09-06 MED ORDER — IBUPROFEN 400 MG PO TABS
800.0000 mg | ORAL_TABLET | Freq: Once | ORAL | Status: AC
  Administered 2013-09-06: 800 mg via ORAL
  Filled 2013-09-06: qty 2

## 2013-09-06 MED ORDER — RABIES VACCINE, PCEC IM SUSR
1.0000 mL | Freq: Once | INTRAMUSCULAR | Status: AC
  Administered 2013-09-06: 1 mL via INTRAMUSCULAR
  Filled 2013-09-06: qty 1

## 2013-09-06 MED ORDER — RABIES IMMUNE GLOBULIN 150 UNIT/ML IM INJ
2100.0000 [IU] | INJECTION | Freq: Once | INTRAMUSCULAR | Status: AC
  Administered 2013-09-06: 2100 [IU] via INTRAMUSCULAR
  Filled 2013-09-06: qty 14

## 2013-09-06 NOTE — ED Notes (Signed)
Bitten on left lower leg by dog on [address] dog not on leash saw dr on Friday and wound was sutured was sent for rabies shots due to unkn shot record of dog and person cannot be found

## 2013-09-06 NOTE — ED Notes (Addendum)
Pt was bitten by a dog Friday and is unable to confirm if dog has had rabies shot.  Was told to come here for rabies vaccine.  Wound is dry and intact.  Pt is on antibiotics.

## 2013-09-06 NOTE — ED Provider Notes (Signed)
CSN: [id number]     Arrival date & time 09/06/13  1026 History  This chart was scribed for Jaynie Crumble, PA, working with Candyce Churn, MD by Blanchard Kelch, ED Scribe. This patient was seen in room TR06C/TR06C and the patient's care was started at 10:59 AM.     Chief Complaint  Patient presents with  . needs rabies vaccine     The history is provided by the patient. No language interpreter was used.    HPI Comments: Joseph Brennan is a 52 y.o. male who presents to the Emergency Department for a rabies vaccination due to a dog bite on his left lower leg that occured three days ago. The patient reports the wound was draining yesterday. He was seen by Dr. Perrin Maltese after the bite who stitched up the wound and prescribed Augmentin.  He denies knowing the dog owner or vaccination record. He denies fever or chills.  Past Medical History  Diagnosis Date  . GERD (gastroesophageal reflux disease)    Past Surgical History  Procedure Laterality Date  . Esophageal dilation    . Right knee arthroscopy      Dr. Rennis Chris for mensicus injury   No family history on file. History  Substance Use Topics  . Smoking status: Never Smoker   . Smokeless tobacco: Not on file  . Alcohol Use: 0.0 oz/week     Comment: social    Review of Systems  Constitutional: Negative for fever and chills.  Skin: Positive for wound.  All other systems reviewed and are negative.    Allergies  Review of patient's allergies indicates no known allergies.  Home Medications   Current Outpatient Rx  Name  Route  Sig  Dispense  Refill  . amoxicillin-clavulanate (AUGMENTIN) 875-125 MG per tablet   Oral   Take 1 tablet by mouth 2 (two) times daily.   20 tablet   0   . esomeprazole (NEXIUM) 20 MG capsule   Oral   Take 20 mg by mouth daily before breakfast.          Triage Vitals: BP 141/95  Pulse 74  Temp(Src) 97.8 F (36.6 C) (Oral)  Resp 18  Ht 6\' 6"  (1.981 m)  Wt 240 lb (108.863 kg)  BMI 27.74  kg/m2  SpO2 97%  Physical Exam  Nursing note and vitals reviewed. Constitutional: He is oriented to person, place, and time. He appears well-developed and well-nourished. No distress.  HENT:  Head: Normocephalic and atraumatic.  Eyes: EOM are normal.  Neck: Neck supple. No tracheal deviation present.  Cardiovascular: Normal rate.   Pulmonary/Chest: Effort normal. No respiratory distress.  Musculoskeletal: Normal range of motion.  Neurological: He is alert and oriented to person, place, and time.  Skin: Skin is warm and dry.  Approximately 3 cm irregular laceration to left lateral mid shin. Two stitches intact. Mild surrounding erythema and tenderness. No drainage.  Psychiatric: He has a normal mood and affect. His behavior is normal.    ED Course  Procedures (including critical care time)  DIAGNOSTIC STUDIES: Oxygen Saturation is 97% on room air, adequate by my interpretation.    COORDINATION OF CARE:  11:03 AM -Will order rabies vaccination and immunoglobulin injections. Patient verbalizes understanding and agrees with treatment plan.   Labs Review Labs Reviewed - No data to display Imaging Review No results found.  MDM   1. Contact with and suspected exposure to rabies    PT with dog bite 3 days ago, here for rabies vaccination  since dog is unknown. Pt is taking augmentin for the bite and his wound appears to be healing well with no current signs of major infection. He was given rabies immunoglobulin, as well as first dose of vaccine.  He was given further follow up instructions.     I personally performed the services described in this documentation, which was scribed in my presence. The recorded information has been reviewed and is accurate.    Lottie Mussel, PA-C 09/06/13 1515

## 2013-09-08 NOTE — ED Provider Notes (Signed)
Medical screening examination/treatment/procedure(s) were performed by non-physician practitioner and as supervising physician I was immediately available for consultation/collaboration.    Candyce Churn, MD 09/08/13 [phone]

## 2013-09-09 ENCOUNTER — Encounter (HOSPITAL_COMMUNITY): Payer: Self-pay | Admitting: *Deleted

## 2013-09-09 ENCOUNTER — Emergency Department (INDEPENDENT_AMBULATORY_CARE_PROVIDER_SITE_OTHER)
Admission: EM | Admit: 2013-09-09 | Discharge: 2013-09-09 | Disposition: A | Discharge status: Home / Self Care | Payer: BC Managed Care – PPO | Source: Home / Self Care

## 2013-09-09 DIAGNOSIS — Z23 Encounter for immunization: Secondary | ICD-10-CM

## 2013-09-09 DIAGNOSIS — Z4802 Encounter for removal of sutures: Secondary | ICD-10-CM

## 2013-09-09 MED ORDER — RABIES VACCINE, PCEC IM SUSR
INTRAMUSCULAR | Status: AC
  Filled 2013-09-09: qty 1

## 2013-09-09 MED ORDER — RABIES VACCINE, PCEC IM SUSR
1.0000 mL | Freq: Once | INTRAMUSCULAR | Status: AC
  Administered 2013-09-09: 1 mL via INTRAMUSCULAR

## 2013-09-09 NOTE — ED Notes (Addendum)
Pt  Here  For next  Rabies  Injection  Also  Wants    Sutures  Removed

## 2013-09-09 NOTE — ED Provider Notes (Signed)
CSN: [id number]     Arrival date & time 09/09/13  1357 History   None    Chief Complaint  Patient presents with  . Rabies Injection   (Consider location/radiation/quality/duration/timing/severity/associated sxs/prior Treatment) HPI Comments: [age] male here for prenatal urgent rabies injection status post dog bite to the left lower leg. He had 2 sutures placed he then went to close it loosely. The day during that the sutures would be tomorrow but he wants to know if he could have early today. After inspection the wound appears to be healing well and no obvious separation. No signs of infection.   Past Medical History  Diagnosis Date  . GERD (gastroesophageal reflux disease)    Past Surgical History  Procedure Laterality Date  . Esophageal dilation    . Right knee arthroscopy      Dr. Rennis Chris for mensicus injury   History reviewed. No pertinent family history. History  Substance Use Topics  . Smoking status: Never Smoker   . Smokeless tobacco: Not on file  . Alcohol Use: 0.0 oz/week     Comment: social    Review of Systems  All other systems reviewed and are negative.    Allergies  Review of patient's allergies indicates no known allergies.  Home Medications   Current Outpatient Rx  Name  Route  Sig  Dispense  Refill  . amoxicillin-clavulanate (AUGMENTIN) 875-125 MG per tablet   Oral   Take 1 tablet by mouth 2 (two) times daily.   20 tablet   0   . esomeprazole (NEXIUM) 20 MG capsule   Oral   Take 20 mg by mouth daily before breakfast.          BP 121/84  Pulse 64  Temp(Src) 98.3 F (36.8 C) (Oral)  Resp 16  SpO2 97% Physical Exam  ED Course  Procedures (including critical care time) Labs Review Labs Reviewed - No data to display Imaging Review No results found.  MDM   1. Visit for suture removal    2 sutures removed by Alinda Money, RN Presents infection or other problems may return.    Hayden Rasmussen, NP 09/09/13 1549

## 2013-09-10 NOTE — ED Provider Notes (Signed)
Medical screening examination/treatment/procedure(s) were performed by resident physician or non-physician practitioner and as supervising physician I was immediately available for consultation/collaboration.   KINDL,JAMES DOUGLAS MD.   James D Kindl, MD 09/10/13 0938 

## 2013-09-13 ENCOUNTER — Emergency Department (INDEPENDENT_AMBULATORY_CARE_PROVIDER_SITE_OTHER)
Admission: EM | Admit: 2013-09-13 | Discharge: 2013-09-13 | Disposition: A | Discharge status: Home / Self Care | Payer: BC Managed Care – PPO | Source: Home / Self Care

## 2013-09-13 ENCOUNTER — Encounter (HOSPITAL_COMMUNITY): Payer: Self-pay

## 2013-09-13 DIAGNOSIS — Z203 Contact with and (suspected) exposure to rabies: Secondary | ICD-10-CM

## 2013-09-13 MED ORDER — RABIES VACCINE, PCEC IM SUSR
INTRAMUSCULAR | Status: AC
  Filled 2013-09-13: qty 1

## 2013-09-13 MED ORDER — RABIES VACCINE, PCEC IM SUSR
1.0000 mL | Freq: Once | INTRAMUSCULAR | Status: AC
  Administered 2013-09-13: 1 mL via INTRAMUSCULAR

## 2013-09-13 NOTE — ED Notes (Signed)
Here for rabies injection.  

## 2013-09-20 ENCOUNTER — Emergency Department (HOSPITAL_COMMUNITY)
Admission: EM | Admit: 2013-09-20 | Discharge: 2013-09-20 | Disposition: A | Discharge status: Home / Self Care | Payer: BC Managed Care – PPO | Source: Home / Self Care

## 2013-09-20 ENCOUNTER — Encounter (HOSPITAL_COMMUNITY): Payer: Self-pay | Admitting: Emergency Medicine

## 2013-09-20 MED ORDER — RABIES VACCINE, PCEC IM SUSR
INTRAMUSCULAR | Status: AC
  Filled 2013-09-20: qty 1

## 2013-09-20 NOTE — ED Notes (Signed)
Rabies vaccination.   

## 2015-10-19 ENCOUNTER — Encounter: Payer: Self-pay | Admitting: Gastroenterology

## 2016-09-04 DIAGNOSIS — L814 Other melanin hyperpigmentation: Secondary | ICD-10-CM | POA: Diagnosis not present

## 2016-09-04 DIAGNOSIS — R21 Rash and other nonspecific skin eruption: Secondary | ICD-10-CM | POA: Diagnosis not present

## 2016-09-04 DIAGNOSIS — L918 Other hypertrophic disorders of the skin: Secondary | ICD-10-CM | POA: Diagnosis not present

## 2017-09-08 DIAGNOSIS — S80861A Insect bite (nonvenomous), right lower leg, initial encounter: Secondary | ICD-10-CM | POA: Diagnosis not present

## 2018-12-14 DIAGNOSIS — M25521 Pain in right elbow: Secondary | ICD-10-CM | POA: Diagnosis not present

## 2018-12-14 DIAGNOSIS — M7711 Lateral epicondylitis, right elbow: Secondary | ICD-10-CM | POA: Diagnosis not present

## 2020-01-05 DIAGNOSIS — H60391 Other infective otitis externa, right ear: Secondary | ICD-10-CM | POA: Diagnosis not present

## 2020-01-27 DIAGNOSIS — H6121 Impacted cerumen, right ear: Secondary | ICD-10-CM | POA: Diagnosis not present

## 2020-01-27 DIAGNOSIS — H9071 Mixed conductive and sensorineural hearing loss, unilateral, right ear, with unrestricted hearing on the contralateral side: Secondary | ICD-10-CM | POA: Diagnosis not present

## 2020-02-24 DIAGNOSIS — M9907 Segmental and somatic dysfunction of upper extremity: Secondary | ICD-10-CM | POA: Diagnosis not present

## 2020-02-24 DIAGNOSIS — Z1331 Encounter for screening for depression: Secondary | ICD-10-CM | POA: Diagnosis not present

## 2020-02-24 DIAGNOSIS — M545 Low back pain: Secondary | ICD-10-CM | POA: Diagnosis not present

## 2020-02-24 DIAGNOSIS — K219 Gastro-esophageal reflux disease without esophagitis: Secondary | ICD-10-CM | POA: Diagnosis not present

## 2020-02-24 DIAGNOSIS — Z803 Family history of malignant neoplasm of breast: Secondary | ICD-10-CM | POA: Diagnosis not present

## 2020-02-28 DIAGNOSIS — H9071 Mixed conductive and sensorineural hearing loss, unilateral, right ear, with unrestricted hearing on the contralateral side: Secondary | ICD-10-CM | POA: Diagnosis not present

## 2020-05-26 DIAGNOSIS — Z0000 Encounter for general adult medical examination without abnormal findings: Secondary | ICD-10-CM | POA: Diagnosis not present

## 2020-05-26 DIAGNOSIS — Z125 Encounter for screening for malignant neoplasm of prostate: Secondary | ICD-10-CM | POA: Diagnosis not present

## 2020-06-01 DIAGNOSIS — Z1212 Encounter for screening for malignant neoplasm of rectum: Secondary | ICD-10-CM | POA: Diagnosis not present

## 2020-06-01 DIAGNOSIS — Z0000 Encounter for general adult medical examination without abnormal findings: Secondary | ICD-10-CM | POA: Diagnosis not present

## 2020-06-01 DIAGNOSIS — R82998 Other abnormal findings in urine: Secondary | ICD-10-CM | POA: Diagnosis not present

## 2020-06-12 DIAGNOSIS — N390 Urinary tract infection, site not specified: Secondary | ICD-10-CM | POA: Diagnosis not present

## 2020-06-12 DIAGNOSIS — R8290 Unspecified abnormal findings in urine: Secondary | ICD-10-CM | POA: Diagnosis not present

## 2020-06-26 DIAGNOSIS — Z1212 Encounter for screening for malignant neoplasm of rectum: Secondary | ICD-10-CM | POA: Diagnosis not present

## 2020-07-26 DIAGNOSIS — H903 Sensorineural hearing loss, bilateral: Secondary | ICD-10-CM | POA: Diagnosis not present

## 2020-07-26 DIAGNOSIS — H6122 Impacted cerumen, left ear: Secondary | ICD-10-CM | POA: Diagnosis not present

## 2020-08-03 ENCOUNTER — Encounter: Payer: Self-pay | Admitting: Gastroenterology

## 2020-08-03 DIAGNOSIS — R351 Nocturia: Secondary | ICD-10-CM | POA: Diagnosis not present

## 2020-08-03 DIAGNOSIS — R3914 Feeling of incomplete bladder emptying: Secondary | ICD-10-CM | POA: Diagnosis not present

## 2020-08-03 DIAGNOSIS — R350 Frequency of micturition: Secondary | ICD-10-CM | POA: Diagnosis not present

## 2020-08-03 DIAGNOSIS — R3121 Asymptomatic microscopic hematuria: Secondary | ICD-10-CM | POA: Diagnosis not present

## 2020-08-15 DIAGNOSIS — R3121 Asymptomatic microscopic hematuria: Secondary | ICD-10-CM | POA: Diagnosis not present

## 2020-08-30 DIAGNOSIS — R3914 Feeling of incomplete bladder emptying: Secondary | ICD-10-CM | POA: Diagnosis not present

## 2020-08-30 DIAGNOSIS — R3121 Asymptomatic microscopic hematuria: Secondary | ICD-10-CM | POA: Diagnosis not present

## 2020-10-04 ENCOUNTER — Ambulatory Visit (INDEPENDENT_AMBULATORY_CARE_PROVIDER_SITE_OTHER): Payer: BC Managed Care – PPO | Admitting: Gastroenterology

## 2020-10-04 ENCOUNTER — Encounter: Payer: Self-pay | Admitting: Gastroenterology

## 2020-10-04 ENCOUNTER — Other Ambulatory Visit: Payer: BC Managed Care – PPO

## 2020-10-04 VITALS — BP 104/70 | HR 77 | Ht 78.0 in | Wt 201.2 lb

## 2020-10-04 DIAGNOSIS — R109 Unspecified abdominal pain: Secondary | ICD-10-CM | POA: Diagnosis not present

## 2020-10-04 DIAGNOSIS — R110 Nausea: Secondary | ICD-10-CM

## 2020-10-04 DIAGNOSIS — K219 Gastro-esophageal reflux disease without esophagitis: Secondary | ICD-10-CM

## 2020-10-04 DIAGNOSIS — K5732 Diverticulitis of large intestine without perforation or abscess without bleeding: Secondary | ICD-10-CM

## 2020-10-04 NOTE — Patient Instructions (Signed)
If you are age 57 or older, your body mass index should be between 23-30. Your Body mass index is 23.25 kg/m. If this is out of the aforementioned range listed, please consider follow up with your Primary Care Provider.  If you are age 57 or younger, your body mass index should be between 19-25. Your Body mass index is 23.25 kg/m. If this is out of the aformentioned range listed, please consider follow up with your Primary Care Provider.   You have been scheduled for a CT scan of the abdomen and pelvis at [facility] ([address]---this is in the same building as [employer]).   You are scheduled on 10-06-2020 at 11am. You should arrive 15 minutes prior to your appointment time for registration. Please follow the written instructions below on the day of your exam:  WARNING: IF YOU ARE ALLERGIC TO IODINE/X-RAY DYE, PLEASE NOTIFY RADIOLOGY IMMEDIATELY AT [phone]! YOU WILL BE GIVEN A 13 HOUR PREMEDICATION PREP.  1) Do not eat or drink anything after 7am (4 hours prior to your test) 2) You have been given 2 bottles of oral contrast to drink. The solution may taste better if refrigerated, but do NOT add ice or any other liquid to this solution. Shake well before drinking.    Drink 1 bottle of contrast @ 9am (2 hours prior to your exam)  Drink 1 bottle of contrast @ 10am (1 hour prior to your exam)  You may take any medications as prescribed with a small amount of water, if necessary. If you take any of the following medications: METFORMIN, GLUCOPHAGE, GLUCOVANCE, AVANDAMET, RIOMET, FORTAMET, [place] MET, JANUMET, GLUMETZA or METAGLIP, you MAY be asked to HOLD this medication 48 hours AFTER the exam.  The purpose of you drinking the oral contrast is to aid in the visualization of your intestinal tract. The contrast solution may cause some diarrhea. Depending on your individual set of symptoms, you may also receive an intravenous injection of x-ray contrast/dye. Plan on  being at [facility] for 30 minutes or longer, depending on the type of exam you are having performed.  This test typically takes 30-45 minutes to complete.  If you have any questions regarding your exam or if you need to reschedule, you may call the CT department at [phone] between the hours of 8:00 am and 5:00 pm, Monday-Friday.  ________________________________________________________________________  Your provider has requested that you go to the basement level for lab work before leaving today. Press "B" on the elevator. The lab is located at the first door on the left as you exit the elevator.  Due to recent changes in healthcare laws, you may see the results of your imaging and laboratory studies on MyChart before your provider has had a chance to review them.  We understand that in some cases there may be results that are confusing or concerning to you. Not all laboratory results come back in the same time frame and the provider may be waiting for multiple results in order to interpret others.  Please give [place] 48 hours in order for your provider to thoroughly review all the results before contacting the office for clarification of your results.    Thank you for trusting me with your gastrointestinal care!    Thornton Park, MD, MPH

## 2020-10-04 NOTE — Progress Notes (Signed)
Referring Provider: Sueanne Margarita, DO Primary Care Physician:  Sueanne Margarita, DO  Reason for Consultation:  Reflux, diverticulitis   IMPRESSION:  Abdominal pain Nausea Reflux not responding to PPI Outside CT showing diverticulitis - treated with Augmentin No prior colon cancer screening Esophageal stricture s/p dilation with the gastroscope 2011  Ongoing left sided abdominal pain despite 10 days of Augmentin for diverticulitis: CT scan recommended to evaluated for complicated diverticulitis. If negative, will proceed with colonoscopy. Increase Metamucil to twice daily. Briefly discussed dietary recommendations for diverticulosis.    Reflux not responding to PPI: EGD recommended, particularly given history of esophageal stricture. Will plan esophageal biopsies for eosinophili esophagitis.   PLAN: Omeprazole 40 QAM, increase to BID if evening symptoms persist after one week Follow a high fiber diet, drink at least 64 ounces of water every day Increase Metamucil to twice daily Obtain CT scan from [facility] (ask them to scan images) Obtain office visits and lab results from [facility] abd/pelvis with contrast EGD and colonoscopy  Please see the "Patient Instructions" section for addition details about the plan.  HPI: Joseph Brennan is a 59 y.o. male referred by Dr. Francesco Sor for further evaluation of reflux and diverticulitis. The history is obtained through the patient. His wife accompanies him to this appointment. He has anxiety, arthritis, GERD.  Works in [employer] as a Engineer, water.  Developed hematuria. Evaluated by [facility]. CT scan showed diverticulitis. Hematuria has since resolved.  He has been having symptoms that he attributes to diverticulitis since July with nausea, chills, fatigue, and a dull, non-radiating left sided abdominal pain. Symptoms improved with Augmentin for 10 days but have not resolved. No change in bowel habits. No  blood in the stool.  He now feels like his symptoms may have been present intermittently over the last several years.   Baseline bowel habits are 1-2 BM daily. No blood. Some or mucous.   Symptoms started during a time of severe stress and he wonders if stress might be related.   He has been confused about diet. Trying to eat enough fiber but is also trying to avoid nuts, seeds, and berries. Uses Metamucil QAM.   Also reports worsening reflux. 15-20 years of symptoms. Using Priloesec OTC for years. But, he has required Pepcid and Tums in the evening over the last 6 months. Some AM dysphonia. Feels like there is phlegm that he can't clear.  Symptoms are primarily at night except for nausea. No evidence for GI bleeding, globus, iron deficiency anemia, anorexia, unexplained weight loss, dysphagia, odynophagia, persistent vomiting, or gastrointestinal cancer in a first-degree relative.  History of dysphagia and esophageal stricture. EGD 05/11/20 had dilation of the stricture with the scope. Esophageal biopsies showed reflux.   No prior colonoscopy.   No known family history of colon cancer or polyps. No family history of uterine/endometrial cancer, pancreatic cancer or gastric/stomach cancer.   Past Medical History:  Diagnosis Date  . Diverticulitis    August 2021  . GERD (gastroesophageal reflux disease)     Past Surgical History:  Procedure Laterality Date  . ADENOIDECTOMY    . ESOPHAGEAL DILATION    . right knee arthroscopy     Dr. Onnie Graham for mensicus injury    Current Outpatient Medications  Medication Sig Dispense Refill  . Ascorbic Acid (VITAMIN C) 100 MG tablet Take 100 mg by mouth daily.    Marland Kitchen b complex vitamins capsule Take 1 capsule by mouth daily.    . cholecalciferol (  VITAMIN D3) 25 MCG (1000 UNIT) tablet Take 1,000 Units by mouth daily.    . Famotidine (PEPCID AC PO) Take 1 tablet by mouth as needed (take at bedtime for acid reflux).    Marland Kitchen MAGNESIUM PO Take 1 tablet by  mouth daily.    . MULTIPLE VITAMIN PO Take 1 tablet by mouth daily.    . Omega-3 Fatty Acids (FISH OIL PO) Take 1 tablet by mouth daily.    . Omeprazole (PRILOSEC PO) Take 10 mg by mouth daily.    . Psyllium (METAMUCIL FIBER PO) Take 1 Scoop by mouth daily.    . Zinc Sulfate (ZINC 15 PO) Take 1 tablet by mouth daily.     No current facility-administered medications for this visit.    Allergies as of 10/04/2020  . (No Known Allergies)    Family History  Problem Relation Age of Onset  . Breast cancer Mother   . Heart disease Father   . Diabetes Father   . Prostate cancer Father   . Skin cancer Father   . Healthy Sister   . Healthy Brother   . Heart disease Maternal Grandfather   . Heart disease Paternal Grandfather   . Healthy Brother   . Colon cancer Neg Hx   . Pancreatic cancer Neg Hx   . Liver disease Neg Hx   . Esophageal cancer Neg Hx   . Stomach cancer Neg Hx     Social History   Socioeconomic History  . Marital status: Married    Spouse name: Not on file  . Number of children: Not on file  . Years of education: Not on file  . Highest education level: Not on file  Occupational History  . Not on file  Tobacco Use  . Smoking status: Never Smoker  . Smokeless tobacco: Never Used  Vaping Use  . Vaping Use: Never used  Substance and Sexual Activity  . Alcohol use: Not Currently  . Drug use: No  . Sexual activity: Yes    Partners: Female    Birth control/protection: None    Comment: married  Other Topics Concern  . Not on file  Social History Narrative  . Not on file   Social Determinants of Health   Financial Resource Strain:   . Difficulty of Paying Living Expenses: Not on file  Food Insecurity:   . Worried About Charity fundraiser in the Last Year: Not on file  . Ran Out of Food in the Last Year: Not on file  Transportation Needs:   . Lack of Transportation (Medical): Not on file  . Lack of Transportation (Non-Medical): Not on file  Physical  Activity:   . Days of Exercise per Week: Not on file  . Minutes of Exercise per Session: Not on file  Stress:   . Feeling of Stress : Not on file  Social Connections:   . Frequency of Communication with Friends and Family: Not on file  . Frequency of Social Gatherings with Friends and Family: Not on file  . Attends Religious Services: Not on file  . Active Member of Clubs or Organizations: Not on file  . Attends Archivist Meetings: Not on file  . Marital Status: Not on file  Intimate Partner Violence:   . Fear of Current or Ex-Partner: Not on file  . Emotionally Abused: Not on file  . Physically Abused: Not on file  . Sexually Abused: Not on file    Review of Systems: 12 system  ROS is negative except as noted above with anxiety, arthritis, back pain, hematuria, fatigue, night sweats.   Physical Exam: General:   Alert,  well-nourished, pleasant and cooperative in NAD Head:  Normocephalic and atraumatic. Eyes:  Sclera clear, no icterus.   Conjunctiva pink. Ears:  Normal auditory acuity. Nose:  No deformity, discharge,  or lesions. Mouth:  No deformity or lesions.   Neck:  Supple; no masses or thyromegaly. Lungs:  Clear throughout to auscultation.   No wheezes. Heart:  Regular rate and rhythm; no murmurs. Abdomen:  Soft, thin, nontender although he localizes his pain to the mid left abdomen down to the left lower abdomen, nondistended, normal bowel sounds, no rebound or guarding. No hepatosplenomegaly.  Carnett's sign negative.  Rectal:  Deferred  Msk:  Symmetrical. No boney deformities LAD: No inguinal or umbilical LAD Extremities:  No clubbing or edema. Neurologic:  Alert and  oriented x4;  grossly nonfocal Skin:  Intact without significant lesions or rashes. Psych:  Alert and cooperative. Normal mood and affect.     Zoie Sarin L. Tarri Glenn, MD, MPH 10/04/2020, 9:19 AM

## 2020-10-05 LAB — BUN/CREATININE RATIO
BUN: 15 mg/dL (ref 7–25)
Creat: 1.22 mg/dL (ref 0.70–1.33)
GFR, Est African American: 76 mL/min/{1.73_m2} (ref >=60)
GFR, Est Non African American: 65 mL/min/{1.73_m2} (ref >=60)

## 2020-10-06 ENCOUNTER — Other Ambulatory Visit: Payer: Self-pay

## 2020-10-06 ENCOUNTER — Ambulatory Visit (INDEPENDENT_AMBULATORY_CARE_PROVIDER_SITE_OTHER)
Admission: RE | Admit: 2020-10-06 | Discharge: 2020-10-06 | Disposition: A | Discharge status: Home / Self Care | Payer: BC Managed Care – PPO | Source: Ambulatory Visit | Attending: Gastroenterology | Admitting: Gastroenterology

## 2020-10-06 DIAGNOSIS — R110 Nausea: Secondary | ICD-10-CM

## 2020-10-06 DIAGNOSIS — R1110 Vomiting, unspecified: Secondary | ICD-10-CM | POA: Diagnosis not present

## 2020-10-06 DIAGNOSIS — R109 Unspecified abdominal pain: Secondary | ICD-10-CM | POA: Diagnosis not present

## 2020-10-06 DIAGNOSIS — K219 Gastro-esophageal reflux disease without esophagitis: Secondary | ICD-10-CM

## 2020-10-06 DIAGNOSIS — K5732 Diverticulitis of large intestine without perforation or abscess without bleeding: Secondary | ICD-10-CM | POA: Diagnosis not present

## 2020-10-06 IMAGING — CT CT ABD-PELV W/ CM
2 of 5 series · 16 of 46 positions shown, 18 images · IV contrast (OMNIPAQUE 300)
Comparison: [DATE]

CLINICAL DATA: General abdominal pain, diverticulitis, nausea and
vomiting

EXAM:
CT ABDOMEN AND PELVIS WITH CONTRAST
TECHNIQUE: Multidetector CT imaging of the abdomen and pelvis was performed
using the standard protocol following bolus administration of
intravenous contrast.
CONTRAST:  100mL OMNIPAQUE IOHEXOL 300 MG/ML SOLN, additional oral
enteric contrast

[Series 2: abd/pel w · axial · 0.79mm/px · z∈[-568,-143]mm · 13 of 95 slices shown, 15 images]
[im 5/95  soft-tissue]
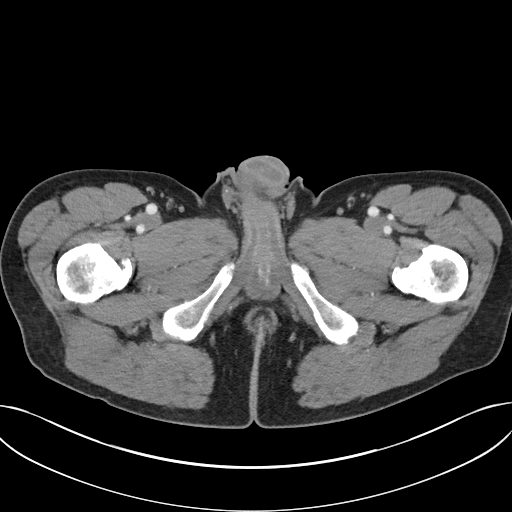
[im 5/95  bone]
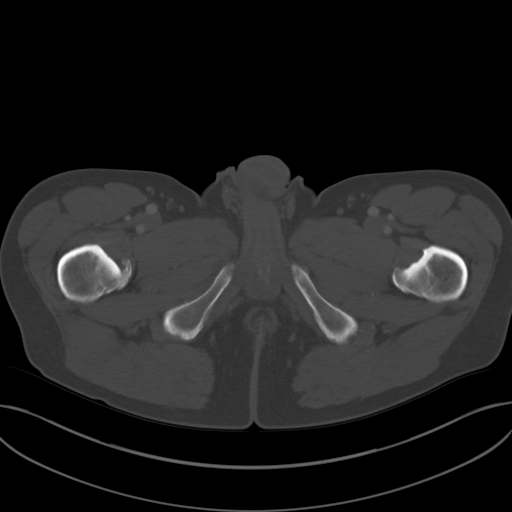
[im 15/95  soft-tissue]
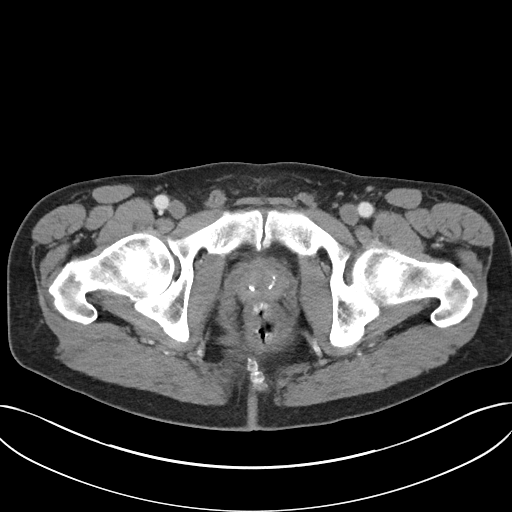
[im 20/95  soft-tissue]
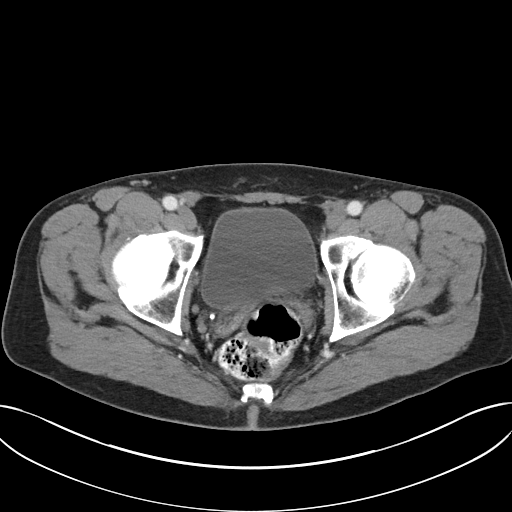
[im 25/95  soft-tissue]
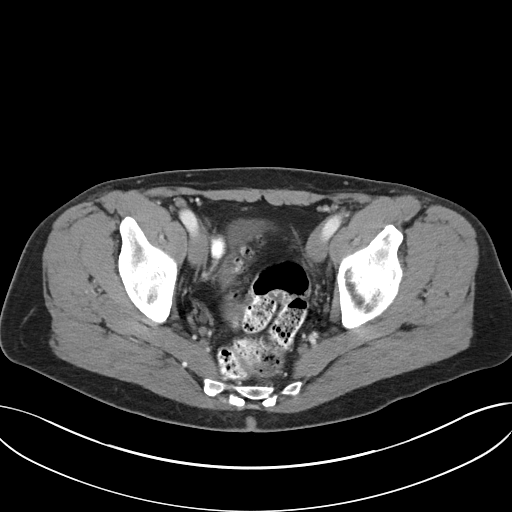
[im 35/95  soft-tissue]
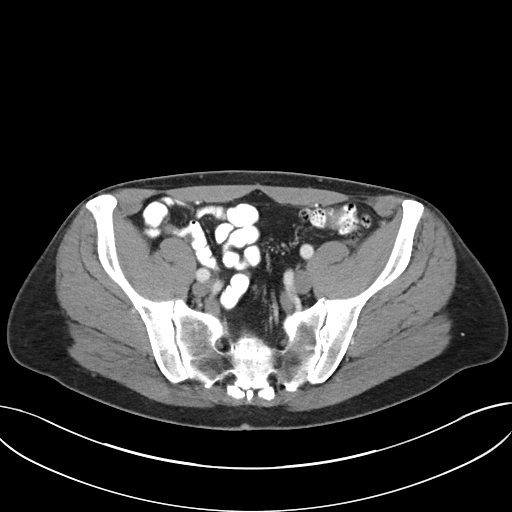
[im 40/95  soft-tissue]
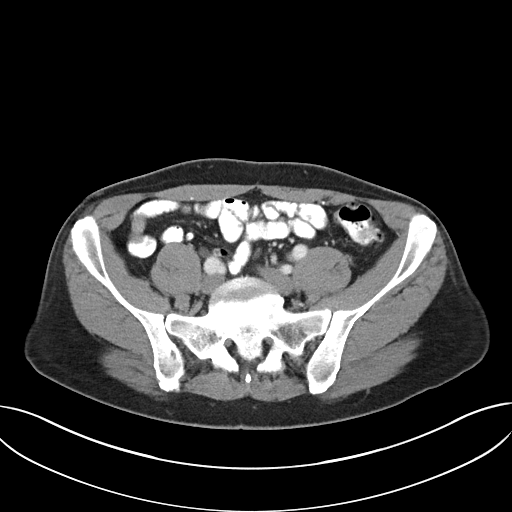
[im 50/95  soft-tissue]
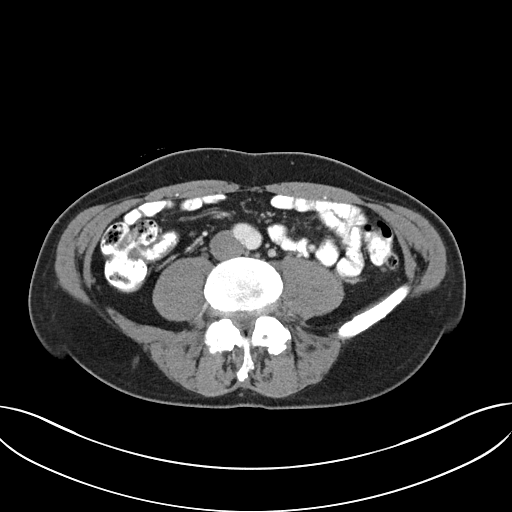
[im 55/95  soft-tissue]
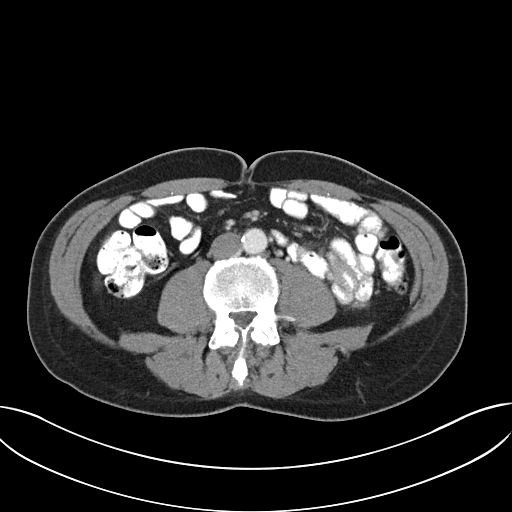
[im 60/95  soft-tissue]
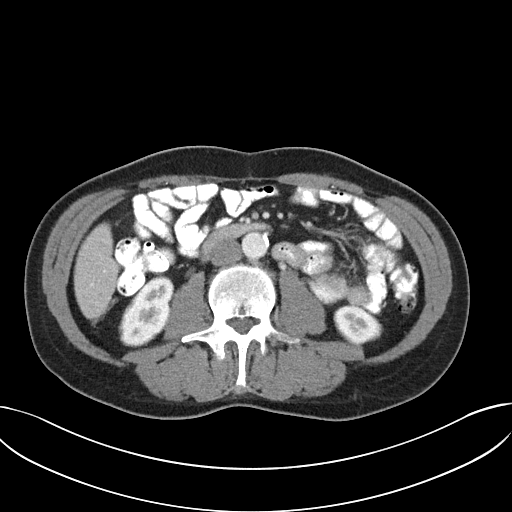
[im 60/95  bone]
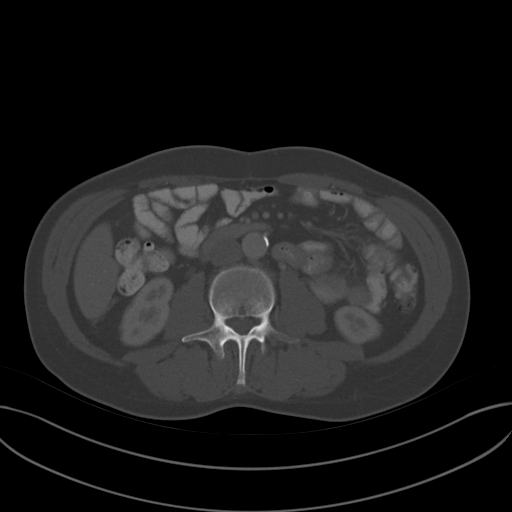
[im 70/95  soft-tissue]
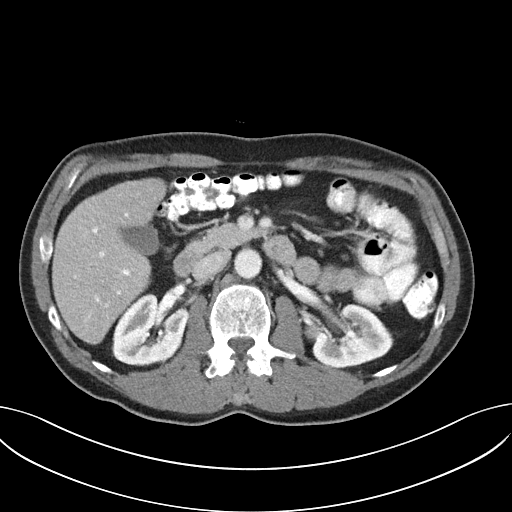
[im 75/95  soft-tissue]
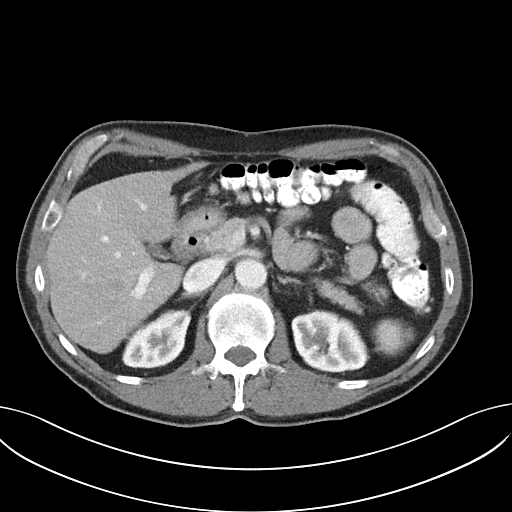
[im 80/95  soft-tissue]
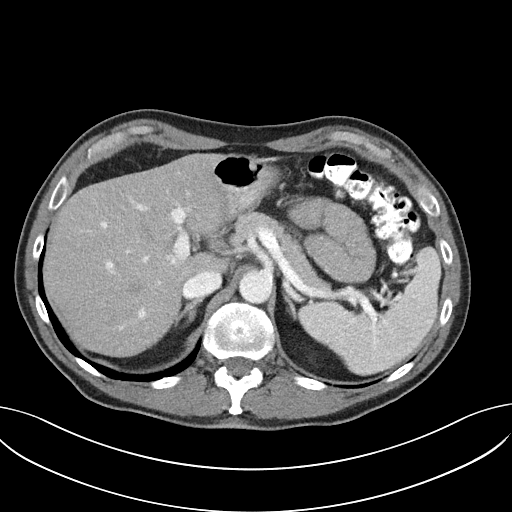
[im 90/95  soft-tissue]
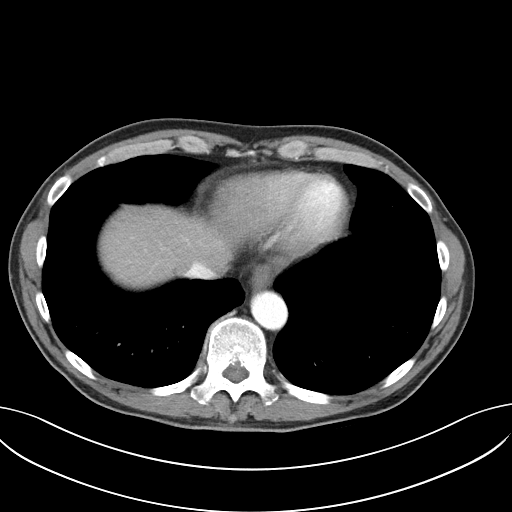

[Series 5: abd/pel w st · coronal · 0.75mm/px · 3 of 65 slices shown]
[im 22/65  soft-tissue]
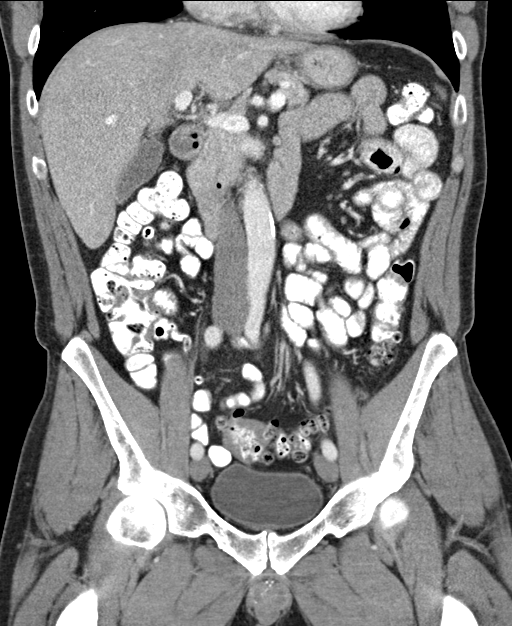
[im 29/65  soft-tissue]
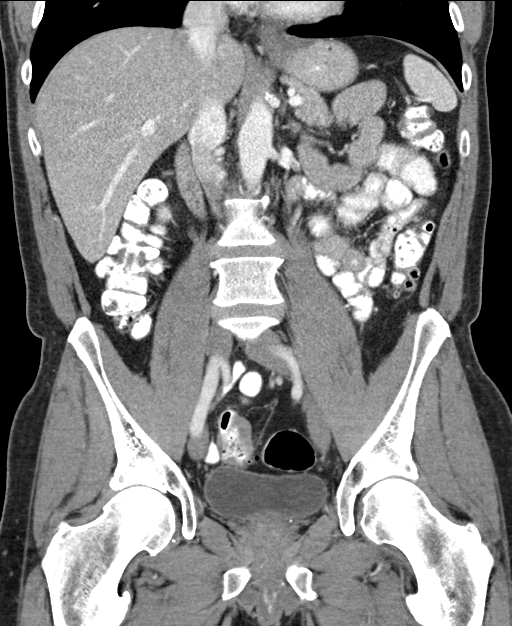
[im 36/65  soft-tissue]
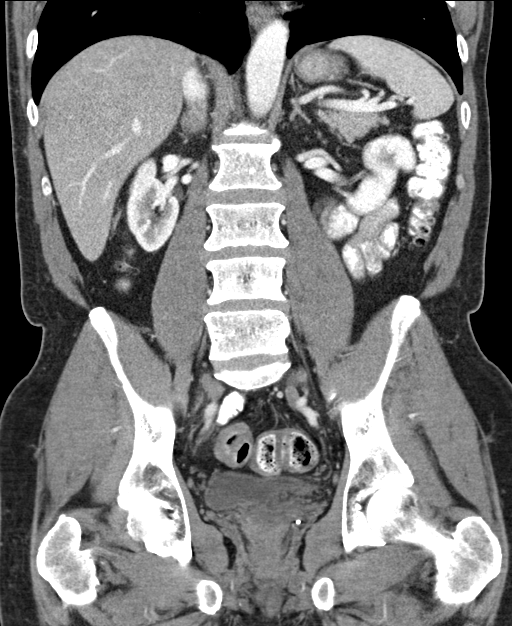

[16 of 46 positions shown; findings below may reference images not displayed]

FINDINGS: Lower chest: No acute abnormality.

Hepatobiliary: No solid liver abnormality is seen. No gallstones,
gallbladder wall thickening, or biliary dilatation.

Pancreas: Unremarkable. No pancreatic ductal dilatation or
surrounding inflammatory changes.

Spleen: Normal in size without significant abnormality.

Adrenals/Urinary Tract: Adrenal glands are unremarkable. Kidneys are
normal, without renal calculi, solid lesion, or hydronephrosis.
Bladder is unremarkable.

Stomach/Bowel: Stomach is within normal limits. Appendix appears
normal. Descending and sigmoid diverticulosis. There is wall
thickening of the mid to distal sigmoid (series 2, image 66) without
adjacent fat stranding or overt evidence of acute inflammation.

Vascular/Lymphatic: Aortic atherosclerosis. No enlarged abdominal or
pelvic lymph nodes.

Reproductive: No mass or other significant abnormality.

Other: No abdominal wall hernia or abnormality. No abdominopelvic
ascites.

Musculoskeletal: No acute or significant osseous findings.
IMPRESSION: 1. Descending and sigmoid diverticulosis. There is wall thickening
of the mid to distal sigmoid without adjacent fat stranding or overt
evidence of acute inflammation. Findings are consistent with either
acute diverticulitis or chronic sequelae of diverticulitis. This
appearance is generally similar to prior examination dated
[DATE] and these findings are not in the location of acute
inflammatory findings of the descending sigmoid colon observed at
that time.
2. Aortic Atherosclerosis ([FY]-[FY]).

## 2020-10-06 MED ORDER — IOHEXOL 300 MG/ML  SOLN
100.0000 mL | Freq: Once | INTRAMUSCULAR | Status: AC | PRN
  Administered 2020-10-06: 100 mL via INTRAVENOUS

## 2020-10-12 ENCOUNTER — Telehealth: Payer: Self-pay | Admitting: Gastroenterology

## 2020-10-12 ENCOUNTER — Telehealth: Payer: Self-pay

## 2020-10-12 NOTE — Telephone Encounter (Signed)
-----   Message from Thornton Park, MD sent at 10/12/2020  2:06 PM EDT ----- Regarding: RE: Endo/colon Yes. That would be fine. Thanks so much for asking!  KLB ----- Message ----- From: Algernon Huxley, RN Sent: 10/12/2020   2:01 PM EDT To: Thornton Park, MD Subject: Endo/colon                                     Pt requests a morning appt on a Thurs or Friday for ECL. There is a 10am appt on 11/19. Would it be ok to squeeze him in for that slot?   Please let me know. Thanks, [employer]

## 2020-10-12 NOTE — Telephone Encounter (Signed)
See additional phone note. 

## 2020-10-12 NOTE — Telephone Encounter (Signed)
Pt scheduled for previsit 10/19/20@8 :30am, Pt scheduled for ECL in the [place] 11/03/20@10am . Pt aware of appts.

## 2020-10-19 ENCOUNTER — Ambulatory Visit (AMBULATORY_SURGERY_CENTER): Payer: Self-pay

## 2020-10-19 ENCOUNTER — Other Ambulatory Visit: Payer: Self-pay

## 2020-10-19 VITALS — Ht 78.0 in | Wt 201.8 lb

## 2020-10-19 DIAGNOSIS — R109 Unspecified abdominal pain: Secondary | ICD-10-CM

## 2020-10-19 DIAGNOSIS — K5732 Diverticulitis of large intestine without perforation or abscess without bleeding: Secondary | ICD-10-CM

## 2020-10-19 DIAGNOSIS — K219 Gastro-esophageal reflux disease without esophagitis: Secondary | ICD-10-CM

## 2020-10-19 MED ORDER — NA SULFATE-K SULFATE-MG SULF 17.5-3.13-1.6 GM/177ML PO SOLN: 1.0000 | Freq: Once | ORAL | 0 refills | Status: AC

## 2020-10-19 NOTE — Progress Notes (Signed)
No allergies to soy or egg Pt is not on blood thinners or diet pills Denies issues with sedation/intubation Denies atrial flutter/fib Denies constipation   Emmi instructions given to pt  Pt is aware of Covid safety and care partner requirements.  

## 2020-10-23 ENCOUNTER — Encounter: Payer: Self-pay | Admitting: Gastroenterology

## 2020-11-01 ENCOUNTER — Other Ambulatory Visit: Payer: Self-pay | Admitting: Gastroenterology

## 2020-11-01 DIAGNOSIS — Z1159 Encounter for screening for other viral diseases: Secondary | ICD-10-CM | POA: Diagnosis not present

## 2020-11-01 LAB — SARS CORONAVIRUS 2 (TAT 6-24 HRS): SARS Coronavirus 2: NEGATIVE

## 2020-11-03 ENCOUNTER — Encounter: Payer: Self-pay | Admitting: Gastroenterology

## 2020-11-03 ENCOUNTER — Ambulatory Visit (AMBULATORY_SURGERY_CENTER): Payer: BC Managed Care – PPO | Admitting: Gastroenterology

## 2020-11-03 ENCOUNTER — Other Ambulatory Visit: Payer: Self-pay

## 2020-11-03 VITALS — BP 95/67 | HR 57 | Temp 97.5°F | Resp 14 | Ht 78.0 in | Wt 201.8 lb

## 2020-11-03 DIAGNOSIS — K2970 Gastritis, unspecified, without bleeding: Secondary | ICD-10-CM | POA: Diagnosis not present

## 2020-11-03 DIAGNOSIS — K208 Other esophagitis without bleeding: Secondary | ICD-10-CM | POA: Diagnosis not present

## 2020-11-03 DIAGNOSIS — K3189 Other diseases of stomach and duodenum: Secondary | ICD-10-CM | POA: Diagnosis not present

## 2020-11-03 DIAGNOSIS — D123 Benign neoplasm of transverse colon: Secondary | ICD-10-CM

## 2020-11-03 DIAGNOSIS — R109 Unspecified abdominal pain: Secondary | ICD-10-CM | POA: Diagnosis not present

## 2020-11-03 DIAGNOSIS — K635 Polyp of colon: Secondary | ICD-10-CM

## 2020-11-03 DIAGNOSIS — D125 Benign neoplasm of sigmoid colon: Secondary | ICD-10-CM

## 2020-11-03 DIAGNOSIS — D122 Benign neoplasm of ascending colon: Secondary | ICD-10-CM

## 2020-11-03 DIAGNOSIS — K5732 Diverticulitis of large intestine without perforation or abscess without bleeding: Secondary | ICD-10-CM | POA: Diagnosis not present

## 2020-11-03 DIAGNOSIS — K219 Gastro-esophageal reflux disease without esophagitis: Secondary | ICD-10-CM

## 2020-11-03 DIAGNOSIS — Z1211 Encounter for screening for malignant neoplasm of colon: Secondary | ICD-10-CM | POA: Diagnosis not present

## 2020-11-03 MED ORDER — SODIUM CHLORIDE 0.9 % IV SOLN: 500.0000 mL | Freq: Once | INTRAVENOUS | Status: DC

## 2020-11-03 NOTE — Op Note (Signed)
[facility] Patient Name: Joseph Brennan Procedure Date: 11/03/2020 10:02 AM MRN: [id number] Endoscopist: Thornton Park MD, MD Age: 59 Referring MD:  Date of Birth: [birth year] Gender: Male Account #: [id number] Procedure:                Colonoscopy Indications:              Abdominal pain                           Outside CT showing diverticulitis - treated with                            Augmentin Medicines:                Monitored Anesthesia Care Procedure:                Pre-Anesthesia Assessment:                           - Prior to the procedure, a History and Physical                            was performed, and patient medications and                            allergies were reviewed. The patient's tolerance of                            previous anesthesia was also reviewed. The risks                            and benefits of the procedure and the sedation                            options and risks were discussed with the patient.                            All questions were answered, and informed consent                            was obtained. Prior Anticoagulants: The patient has                            taken no previous anticoagulant or antiplatelet                            agents. ASA Grade Assessment: II - A patient with                            mild systemic disease. After reviewing the risks                            and benefits, the patient was deemed in  satisfactory condition to undergo the procedure.                           After obtaining informed consent, the colonoscope                            was passed under direct vision. Throughout the                            procedure, the patient's blood pressure, pulse, and                            oxygen saturations were monitored continuously. The                            Colonoscope was introduced through the anus and                            advanced  to the 3 cm into the ileum. The                            colonoscopy was performed without difficulty. The                            patient tolerated the procedure well. The quality                            of the bowel preparation was good. The terminal                            ileum, ileocecal valve, appendiceal orifice, and                            rectum were photographed. Scope In: 10:19:16 AM Scope Out: 10:35:04 AM Scope Withdrawal Time: 0 hours 13 minutes 7 seconds  Total Procedure Duration: 0 hours 15 minutes 48 seconds  Findings:                 The perianal and digital rectal examinations were                            normal.                           Multiple small and large-mouthed diverticula were                            found in the sigmoid colon and distal descending                            colon. The lumen is congested in the sigmoid colon                            in the area of the densest diverticulosis. There  was no evidence for diverticulitis or SCAD.                           A 2 mm polyp was found in the sigmoid colon. The                            polyp was flat. The polyp was removed with a cold                            snare. Resection and retrieval were complete.                            Estimated blood loss was minimal.                           A 2 mm polyp was found in the transverse colon. The                            polyp was sessile. The polyp was removed with a                            cold snare. Resection and retrieval were complete.                            Estimated blood loss was minimal.                           A 4 mm polyp was found in the proximal ascending                            colon. The polyp was sessile. The polyp was removed                            with a cold snare. Resection and retrieval were                            complete. Estimated blood loss was minimal.                            The exam was otherwise without abnormality on                            direct and retroflexion views. Complications:            No immediate complications. Estimated blood loss:                            Minimal. Estimated Blood Loss:     Estimated blood loss was minimal. Impression:               - Diverticulosis in the sigmoid colon and in the                            distal  descending colon.                           - One 2 mm polyp in the sigmoid colon, removed with                            a cold snare. Resected and retrieved.                           - One 2 mm polyp in the transverse colon, removed                            with a cold snare. Resected and retrieved.                           - One 4 mm polyp in the proximal ascending colon,                            removed with a cold snare. Resected and retrieved.                           - The examination was otherwise normal on direct                            and retroflexion views. Recommendation:           - Patient has a contact number available for                            emergencies. The signs and symptoms of potential                            delayed complications were discussed with the                            patient. Return to normal activities tomorrow.                            Written discharge instructions were provided to the                            patient.                           - Follow a high fiber diet. Drink at least 64                            ounces of water daily. Add a daily stool bulking                            agent such as psyllium (an exampled would be                            Metamucil).                           -  Await pathology results.                           - Repeat colonoscopy date to be determined after                            pending pathology results are reviewed for                            surveillance.                            - Emerging evidence supports eating a diet of                            fruits, vegetables, grains, calcium, and yogurt                            while reducing red meat and alcohol may reduce the                            risk of colon cancer.                           - Limit the use of NSAIDs, as these have been                            associated with recurrent diverticulitis.                           - Thank you for allowing me to be involved in your                            colon cancer prevention. Thornton Park MD, MD 11/03/2020 10:44:32 AM This report has been signed electronically.

## 2020-11-03 NOTE — Progress Notes (Signed)
Called to room to assist during endoscopic procedure.  Patient ID and intended procedure confirmed with present staff. Received instructions for my participation in the procedure from the performing physician.  

## 2020-11-03 NOTE — Patient Instructions (Signed)
YOU HAD AN ENDOSCOPIC PROCEDURE TODAY AT THE [facility]:   Refer to the procedure report that was given to you for any specific questions about what was found during the examination.  If the procedure report does not answer your questions, please call your gastroenterologist to clarify.  If you requested that your care partner not be given the details of your procedure findings, then the procedure report has been included in a sealed envelope for you to review at your convenience later.  YOU SHOULD EXPECT: Some feelings of bloating in the abdomen. Passage of more gas than usual.  Walking can help get rid of the air that was put into your GI tract during the procedure and reduce the bloating. If you had a lower endoscopy (such as a colonoscopy or flexible sigmoidoscopy) you may notice spotting of blood in your stool or on the toilet paper. If you underwent a bowel prep for your procedure, you may not have a normal bowel movement for a few days.  Please Note:  You might notice some irritation and congestion in your nose or some drainage.  This is from the oxygen used during your procedure.  There is no need for concern and it should clear up in a day or so.  SYMPTOMS TO REPORT IMMEDIATELY:   Following lower endoscopy (colonoscopy or flexible sigmoidoscopy):  Excessive amounts of blood in the stool  Significant tenderness or worsening of abdominal pains  Swelling of the abdomen that is new, acute  Fever of 100F or higher   Following upper endoscopy (EGD)  Vomiting of blood or coffee ground material  New chest pain or pain under the shoulder blades  Painful or persistently difficult swallowing  New shortness of breath  Fever of 100F or higher  Black, tarry-looking stools  For urgent or emergent issues, a gastroenterologist can be reached at any hour by calling [phone]. Do not use MyChart messaging for urgent concerns.    DIET:  We do recommend a small meal at first, but  then you may proceed to your regular diet.  Drink plenty of fluids but you should avoid alcoholic beverages for 24 hours.  ACTIVITY:  You should plan to take it easy for the rest of today and you should NOT DRIVE or use heavy machinery until tomorrow (because of the sedation medicines used during the test).    FOLLOW UP: Our staff will call the number listed on your records 48-72 hours following your procedure to check on you and address any questions or concerns that you may have regarding the information given to you following your procedure. If we do not reach you, we will leave a message.  We will attempt to reach you two times.  During this call, we will ask if you have developed any symptoms of COVID 19. If you develop any symptoms (ie: fever, flu-like symptoms, shortness of breath, cough etc.) before then, please call [phone].  If you test positive for Covid 19 in the 2 weeks post procedure, please call and report this information to us.    If any biopsies were taken you will be contacted by phone or by letter within the next 1-3 weeks.  Please call us at [phone] if you have not heard about the biopsies in 3 weeks.    SIGNATURES/CONFIDENTIALITY: You and/or your care partner have signed paperwork which will be entered into your electronic medical record.  These signatures attest to the fact that that the information above on   your After Visit Summary has been reviewed and is understood.  Full responsibility of the confidentiality of this discharge information lies with you and/or your care-partner. 

## 2020-11-03 NOTE — Op Note (Signed)
[facility] Patient Name: Joseph Brennan Procedure Date: 11/03/2020 10:03 AM MRN: [id number] Endoscopist: Thornton Park MD, MD Age: 59 Referring MD:  Date of Birth: 05-Apr-1961 Gender: Male Account #: [id number] Procedure:                Upper GI endoscopy Indications:              Abdominal pain, Nausea Medicines:                Monitored Anesthesia Care Procedure:                Pre-Anesthesia Assessment:                           - Prior to the procedure, a History and Physical                            was performed, and patient medications and                            allergies were reviewed. The patient's tolerance of                            previous anesthesia was also reviewed. The risks                            and benefits of the procedure and the sedation                            options and risks were discussed with the patient.                            All questions were answered, and informed consent                            was obtained. Prior Anticoagulants: The patient has                            taken no previous anticoagulant or antiplatelet                            agents. ASA Grade Assessment: II - A patient with                            mild systemic disease. After reviewing the risks                            and benefits, the patient was deemed in                            satisfactory condition to undergo the procedure.                           After obtaining informed consent, the endoscope was  passed under direct vision. Throughout the                            procedure, the patient's blood pressure, pulse, and                            oxygen saturations were monitored continuously. The                            Endoscope was introduced through the mouth, and                            advanced to the third part of duodenum. The upper                            GI endoscopy was accomplished  without difficulty.                            The patient tolerated the procedure well. Scope In: Scope Out: Findings:                 The examined esophagus was normal. Biopsies were                            obtained from the proximal and distal esophagus                            with cold forceps for histology.                           Diffuse minimal inflammation characterized by                            erythema and friability was found in the gastric                            body. Biopsies were taken from the antrum, body,                            and fundus with a cold forceps for histology.                            Estimated blood loss was minimal.                           Diffuse mildly erythematous mucosa without active                            bleeding and with no stigmata of bleeding was found                            in the duodenal bulb. Biopsies were taken with a  cold forceps for histology. Estimated blood loss                            was minimal.                           The cardia and gastric fundus were normal on                            retroflexion.                           The exam was otherwise without abnormality. Complications:            No immediate complications. Estimated blood loss:                            Minimal. Estimated Blood Loss:     Estimated blood loss was minimal. Impression:               - Normal esophagus. Biopsied.                           - Gastritis. Biopsied.                           - Erythematous duodenopathy. Biopsied.                           - The examination was otherwise normal. Recommendation:           - Patient has a contact number available for                            emergencies. The signs and symptoms of potential                            delayed complications were discussed with the                            patient. Return to normal activities tomorrow.                             Written discharge instructions were provided to the                            patient.                           - Resume previous diet.                           - Continue present medications.                           - Await pathology results.                           - No aspirin, ibuprofen,  naproxen, or other                            non-steroidal anti-inflammatory drugs. Thornton Park MD, MD 11/03/2020 10:39:42 AM This report has been signed electronically.

## 2020-11-03 NOTE — Progress Notes (Signed)
To PACU, VSS. Report to [email or web address] 

## 2020-11-07 ENCOUNTER — Telehealth: Payer: Self-pay

## 2020-11-07 ENCOUNTER — Telehealth: Payer: Self-pay | Admitting: *Deleted

## 2020-11-07 NOTE — Telephone Encounter (Signed)
  Follow up Call-  Call back number 11/03/2020  Post procedure Call Back phone  # [phone]  Permission to leave phone message Yes  Some recent data might be hidden     Patient questions:  Do you have a fever, pain , or abdominal swelling? No. Pain Score  0 *  Have you tolerated food without any problems? Yes.    Have you been able to return to your normal activities? Yes.    Do you have any questions about your discharge instructions: Diet   No. Medications  No. Follow up visit  No.  Do you have questions or concerns about your Care? No.  Actions: * If pain score is 4 or above: No action needed, pain <4.  1. Have you developed a fever since your procedure? no  2.   Have you had an respiratory symptoms (SOB or cough) since your procedure? no  3.   Have you tested positive for COVID 19 since your procedure no  4.   Have you had any family members/close contacts diagnosed with the COVID 19 since your procedure?  no   If yes to any of these questions please route to Joylene John, RN and Joella Prince, RN

## 2020-11-07 NOTE — Telephone Encounter (Signed)
First post procedure follow up call no answer 

## 2020-11-24 ENCOUNTER — Telehealth: Payer: Self-pay | Admitting: Gastroenterology

## 2020-11-24 NOTE — Telephone Encounter (Signed)
See result note.  

## 2020-11-24 NOTE — Telephone Encounter (Signed)
Pt is requesting a call back from a nurse to discuss his pathology report.

## 2021-09-27 DIAGNOSIS — Z125 Encounter for screening for malignant neoplasm of prostate: Secondary | ICD-10-CM | POA: Diagnosis not present

## 2021-09-27 DIAGNOSIS — Z0000 Encounter for general adult medical examination without abnormal findings: Secondary | ICD-10-CM | POA: Diagnosis not present

## 2021-10-03 DIAGNOSIS — Z1389 Encounter for screening for other disorder: Secondary | ICD-10-CM | POA: Diagnosis not present

## 2021-10-03 DIAGNOSIS — Z0000 Encounter for general adult medical examination without abnormal findings: Secondary | ICD-10-CM | POA: Diagnosis not present

## 2021-10-03 DIAGNOSIS — I4891 Unspecified atrial fibrillation: Secondary | ICD-10-CM | POA: Diagnosis not present

## 2021-10-03 DIAGNOSIS — Z1331 Encounter for screening for depression: Secondary | ICD-10-CM | POA: Diagnosis not present

## 2021-10-04 DIAGNOSIS — R5381 Other malaise: Secondary | ICD-10-CM | POA: Diagnosis not present

## 2021-10-04 DIAGNOSIS — I4891 Unspecified atrial fibrillation: Secondary | ICD-10-CM | POA: Diagnosis not present

## 2021-10-04 DIAGNOSIS — R3129 Other microscopic hematuria: Secondary | ICD-10-CM | POA: Diagnosis not present

## 2021-10-16 ENCOUNTER — Other Ambulatory Visit: Payer: Self-pay

## 2021-10-16 ENCOUNTER — Ambulatory Visit: Payer: BC Managed Care – PPO | Admitting: Student

## 2021-10-16 ENCOUNTER — Encounter: Payer: Self-pay | Admitting: Student

## 2021-10-16 VITALS — BP 127/83 | HR 57 | Resp 16 | Ht 78.0 in | Wt 201.0 lb

## 2021-10-16 DIAGNOSIS — I480 Paroxysmal atrial fibrillation: Secondary | ICD-10-CM | POA: Diagnosis not present

## 2021-10-16 NOTE — Progress Notes (Signed)
Primary Physician/Referring:  Sueanne Margarita, DO  Patient ID: Joseph Brennan, male    DOB: 1961/02/07, 60 y.o.   MRN: [id number]  Chief Complaint  Patient presents with   Atrial Fibrillation   New Patient (Initial Visit)   HPI:    Joseph Brennan  is a 60 y.o. with history of diverticulitis, GERD, and anxiety.  Otherwise no significant medical history.  Denies history of hypertension, hyperlipidemia, diabetes.  Denies family history of premature CAD.  Patient is referred to our office by PCP for evaluation treatment of atrial fibrillation which was recorded on patient's apple watch.  Patient is relatively active working in his yard frequently and walking approximately 20 minutes/day without issue.  Patient also regularly stretches and does push-ups.  Patient reports on 08/16/2021 he was working in the yard when he developed palpitations, dizziness, and a feeling of fatigue.  He states this episode lasted approximately 4 minutes and resolved when he sat and rested for a few minutes inside before returning to working in the yard.  Patient reports he received an alert at this time from his apple watch that he was in atrial fibrillation.  Patient brings with him tracings from this episode.  He states since this first episode he has had 3 more occurrences most recently 09/30/2021.  These episodes have come with the same palpitations and dizziness as well as fatigue, however lasted less than 4 minutes.  Maximum heart rate patient has noted on his apple watch is 140 bpm.  Patient was recently evaluated by his PCP and advised to start metoprolol, however patient is hesitant to take medications at this time and therefore has not started metoprolol.  Patient denies chest pain, dyspnea, syncope, near syncope.  He is accompanied by his wife at today's office visit.  Past Medical History:  Diagnosis Date   Anxiety    Arthritis    Diverticulitis    August 2021   GERD (gastroesophageal reflux disease)     Past Surgical History:  Procedure Laterality Date   ADENOIDECTOMY     ESOPHAGEAL DILATION     right knee arthroscopy     Dr. Onnie Graham for mensicus injury   UPPER GASTROINTESTINAL ENDOSCOPY  2011   Family History  Problem Relation Age of Onset   Breast cancer Mother    Heart disease Father    Diabetes Father    Prostate cancer Father    Skin cancer Father    Healthy Sister    Healthy Brother    Heart disease Maternal Grandfather    Heart disease Paternal Grandfather    Healthy Brother    Colon cancer Neg Hx    Pancreatic cancer Neg Hx    Liver disease Neg Hx    Esophageal cancer Neg Hx    Stomach cancer Neg Hx    Colon polyps Neg Hx    Rectal cancer Neg Hx     Social History   Tobacco Use   Smoking status: Never   Smokeless tobacco: Never  Substance Use Topics   Alcohol use: Not Currently   Marital Status: Married   ROS  Review of Systems  Constitutional: Positive for malaise/fatigue. Negative for weight gain.  Cardiovascular:  Positive for palpitations. Negative for chest pain, claudication, leg swelling, near-syncope, orthopnea, paroxysmal nocturnal dyspnea and syncope.  Respiratory:  Negative for shortness of breath.   Neurological:  Positive for dizziness.   Objective  Blood pressure 127/83, pulse (!) 57, resp. rate 16, height 6\' 6"  (1.981 m), weight 201  lb (91.2 kg), SpO2 98 %.  Vitals with BMI 10/16/2021 11/03/2020 11/03/2020  Height 6\' 6"  - -  Weight 201 lbs - -  BMI 81.19 - -  Systolic 147 95 90  Diastolic 83 67 54  Pulse 57 57 67      Physical Exam Vitals reviewed.  HENT:     Head: Normocephalic and atraumatic.  Cardiovascular:     Rate and Rhythm: Normal rate and regular rhythm.     Pulses: Intact distal pulses.          Carotid pulses are 2+ on the right side and 2+ on the left side.      Radial pulses are 2+ on the right side and 2+ on the left side.       Dorsalis pedis pulses are 2+ on the right side and 2+ on the left side.        Posterior tibial pulses are 2+ on the right side and 2+ on the left side.     Heart sounds: S1 normal and S2 normal. No murmur heard.   No gallop.  Pulmonary:     Effort: Pulmonary effort is normal. No respiratory distress.     Breath sounds: No wheezing, rhonchi or rales.  Musculoskeletal:     Right lower leg: No edema.     Left lower leg: No edema.  Neurological:     Mental Status: He is alert.    Laboratory examination:   No results for input(s): NA, K, CL, CO2, GLUCOSE, BUN, CREATININE, CALCIUM, GFRNONAA, GFRAA in the last 8760 hours. CrCl cannot be calculated (Patient's most recent lab result is older than the maximum 21 days allowed.).  CMP Latest Ref Rng & Units 10/04/2020  BUN 7 - 25 mg/dL 15  Creatinine 0.70 - 1.33 mg/dL 1.22   No flowsheet data found.  Lipid Panel No results for input(s): CHOL, TRIG, LDLCALC, VLDL, HDL, CHOLHDL, LDLDIRECT in the last 8760 hours.  HEMOGLOBIN A1C No results found for: HGBA1C, MPG TSH No results for input(s): TSH in the last 8760 hours.  External labs:  09/27/2021: BUN 20, creatinine 0.9, GFR >60, sodium 139, potassium 4.7 Hgb 16.1, HCT 46.1, platelet 148 Total cholesterol 156, triglycerides 51, HDL 52, LDL 94  Allergies  No Known Allergies   Medications Prior to Visit:   Outpatient Medications Prior to Visit  Medication Sig Dispense Refill   Ascorbic Acid (VITAMIN C) 100 MG tablet Take 100 mg by mouth daily.     b complex vitamins capsule Take 1 capsule by mouth daily.     cholecalciferol (VITAMIN D3) 25 MCG (1000 UNIT) tablet Take 1,000 Units by mouth daily.     Famotidine (PEPCID AC PO) Take 1 tablet by mouth as needed (take at bedtime for acid reflux).     MAGNESIUM PO Take 1 tablet by mouth daily.     MULTIPLE VITAMIN PO Take 1 tablet by mouth daily.     Omega-3 Fatty Acids (FISH OIL PO) Take 1 tablet by mouth daily.     Omeprazole (PRILOSEC PO) Take 10 mg by mouth daily.     Psyllium (METAMUCIL FIBER PO) Take 1 Scoop  by mouth daily.     Zinc Sulfate (ZINC 15 PO) Take 1 tablet by mouth daily.     metoprolol tartrate (LOPRESSOR) 25 MG tablet Take 12.5 mg by mouth 2 (two) times daily.     No facility-administered medications prior to visit.   Final Medications at End of Visit  Current Meds  Medication Sig   Ascorbic Acid (VITAMIN C) 100 MG tablet Take 100 mg by mouth daily.   b complex vitamins capsule Take 1 capsule by mouth daily.   cholecalciferol (VITAMIN D3) 25 MCG (1000 UNIT) tablet Take 1,000 Units by mouth daily.   Famotidine (PEPCID AC PO) Take 1 tablet by mouth as needed (take at bedtime for acid reflux).   MAGNESIUM PO Take 1 tablet by mouth daily.   MULTIPLE VITAMIN PO Take 1 tablet by mouth daily.   Omega-3 Fatty Acids (FISH OIL PO) Take 1 tablet by mouth daily.   Omeprazole (PRILOSEC PO) Take 10 mg by mouth daily.   Psyllium (METAMUCIL FIBER PO) Take 1 Scoop by mouth daily.   Zinc Sulfate (ZINC 15 PO) Take 1 tablet by mouth daily.   Radiology:   No results found.  Cardiac Studies:   None  EKG:   10/16/2021: Sinus rhythm at a rate of 51 bpm.  Normal axis.  No evidence of ischemia or underlying injury pattern.  I personally reviewed tracings from 4 episodes of atrial fibrillation recorded on patient's apple watch.  Tracings appear consistent with paroxysmal atrial fibrillation with RVR, rates as high as 140s beats per minute.  I have requested the patient upload tracings to my chart so that these can be included in his medical record, patient verbalized understanding and agreement.  Assessment     ICD-10-CM   1. Paroxysmal atrial fibrillation (HCC)  I48.0 EKG 12-Lead    PCV ECHOCARDIOGRAM COMPLETE    PCV CARDIAC STRESS TEST      This patients CHA2DS2-VASc Score 1 (aortic atherosclerosis on previous CT) and yearly risk of stroke 0.6%.   There are no discontinued medications.  No orders of the defined types were placed in this encounter.   Recommendations:   Kenechukwu Eckstein is a 60 y.o. with history of diverticulitis, GERD, and anxiety.  Otherwise no significant medical history.  Denies history of hypertension, hyperlipidemia, diabetes.  Denies family history of premature CAD.  Patient is referred to our office by PCP for evaluation treatment of atrial fibrillation which was recorded on patient's apple watch.  I personally reviewed apple watch tracings which noted atrial fibrillation with rapid ventricular response, longest episode lasting approximately 4 minutes on 08/16/2021.  Reviewed and discussed at length with patient and his wife who is present at bedside regarding pathophysiology of atrial fibrillation.  Discussed increased risk of thromboembolic phenomenon with atrial fibrillation.  Reviewed indications, risks, benefits, and alternatives of oral anticoagulation, patient's questions were addressed.  Given that episodes are brief and patient's CHA2DS2-VASc score is low, would not recommend anticoagulation at this time, patient verbalized understanding and agrees on foregoing oral anticoagulation at this time.  Also discussed rate control strategy with patient.  Patient's heart rate is relatively low at approximately 51 bpm on today's EKG and pressure is well controlled.  Therefore discussed option of initiating metoprolol versus watchful waiting given low heart rate at baseline. Also discussed option of cardiac monitoring, however shared decision was to hold off at this time.   Given new onset atrial fibrillation will obtain echocardiogram and exercise stress test.   Further recommendations pending cardiac testing.  During this visit I reviewed and updated: Tobacco history  allergies medication reconciliation  medical history  surgical history  family history  social history.  This note was created using a voice recognition software as a result there may be grammatical errors inadvertently enclosed that do not reflect the nature  of this encounter. Every  attempt is made to correct such errors.   Alethia Berthold, PA-C 10/16/2021, 3:56 PM Office: [phone]

## 2021-10-23 ENCOUNTER — Other Ambulatory Visit: Payer: BC Managed Care – PPO

## 2021-10-26 ENCOUNTER — Other Ambulatory Visit: Payer: Self-pay

## 2021-10-26 ENCOUNTER — Ambulatory Visit: Payer: BC Managed Care – PPO

## 2021-10-26 DIAGNOSIS — I480 Paroxysmal atrial fibrillation: Secondary | ICD-10-CM

## 2021-11-26 ENCOUNTER — Ambulatory Visit: Payer: BC Managed Care – PPO | Admitting: Student

## 2021-12-23 NOTE — Progress Notes (Signed)
Chief Complaint  Patient presents with   New Patient (Initial Visit)    Atrial fib   History of Present Illness: 61 yo male with history of anxiety, diverticulitis, GERD and atrial fibrillation and recently discovered aortic valve insufficiency who is here today as a new patient to establish care. He was seen in [facility] on 10/16/21 and was evaluated for palpitations with report of atrial fib on his Apple watch. CHADS VASC score of 1 (aortic atherosclerosis on CT) and anti-coagulation was not started. Echo 10/27/21 with LVEF=60-65%, moderate AI, mild MR. I reviewed his EKG strips from his watch. One episode in September, 2022, 3 episodes in October 2022 and one on October 17, 2021. Longest 4 minutes and appears to be atrial fib. No episodes since November 2,2022.   He tells me today that he is feeling well. No chest pain, dyspnea, palpitations. He is very active.   Primary Care Physician: Sueanne Margarita, DO   Past Medical History:  Diagnosis Date   A-fib [facility])    Anxiety    Arthritis    Chest pain    Diverticulitis    August 2021   Diverticulosis    Fatigue    GERD (gastroesophageal reflux disease)    Hand arthritis    Hematuria    Insomnia    Lightheadedness     Past Surgical History:  Procedure Laterality Date   ADENOIDECTOMY     ESOPHAGEAL DILATION     right knee arthroscopy     Dr. Onnie Graham for mensicus injury   UPPER GASTROINTESTINAL ENDOSCOPY  2011    Current Outpatient Medications  Medication Sig Dispense Refill   aspirin EC 81 MG tablet Take 1 tablet (81 mg total) by mouth daily. Swallow whole. 90 tablet 3   Ascorbic Acid (VITAMIN C) 100 MG tablet Take 100 mg by mouth daily.     b complex vitamins capsule Take 1 capsule by mouth daily.     cholecalciferol (VITAMIN D3) 25 MCG (1000 UNIT) tablet Take 1,000 Units by mouth daily.     Famotidine (PEPCID AC PO) Take 1 tablet by mouth as needed (take at bedtime for acid reflux).     MAGNESIUM PO Take 1 tablet  by mouth daily.     MULTIPLE VITAMIN PO Take 1 tablet by mouth daily.     Omega-3 Fatty Acids (FISH OIL PO) Take 1 tablet by mouth daily.     Omeprazole (PRILOSEC PO) Take 10 mg by mouth daily.     Psyllium (METAMUCIL FIBER PO) Take 1 Scoop by mouth daily.     Zinc Sulfate (ZINC 15 PO) Take 1 tablet by mouth daily.     No current facility-administered medications for this visit.    No Known Allergies  Social History   Socioeconomic History   Marital status: Married    Spouse name: Not on file   Number of children: 2   Years of education: Not on file   Highest education level: Not on file  Occupational History   Occupation: Music therapist  Tobacco Use   Smoking status: Never   Smokeless tobacco: Never  Vaping Use   Vaping Use: Never used  Substance and Sexual Activity   Alcohol use: Not Currently   Drug use: No   Sexual activity: Yes    Partners: Female    Birth control/protection: None    Comment: married  Other Topics Concern   Not on file  Social History Narrative   Not on file  Social Determinants of Health   Financial Resource Strain: Not on file  Food Insecurity: Not on file  Transportation Needs: Not on file  Physical Activity: Not on file  Stress: Not on file  Social Connections: Not on file  Intimate Partner Violence: Not on file    Family History  Problem Relation Age of Onset   Breast cancer Mother    Heart disease Father    Diabetes Father    Prostate cancer Father    Skin cancer Father    Healthy Sister    Healthy Brother    Heart disease Maternal Grandfather    Heart disease Paternal Grandfather    Healthy Brother    Colon cancer Neg Hx    Pancreatic cancer Neg Hx    Liver disease Neg Hx    Esophageal cancer Neg Hx    Stomach cancer Neg Hx    Colon polyps Neg Hx    Rectal cancer Neg Hx     Review of Systems:  As stated in the HPI and otherwise negative.   BP 118/64    Pulse (!) 55    Ht 6\' 6"  (1.981 m)    Wt 201 lb 9.6 oz (91.4  kg)    SpO2 98%    BMI 23.30 kg/m   Physical Examination: General: Well developed, well nourished, NAD  HEENT: OP clear, mucus membranes moist  SKIN: warm, dry. No rashes. Neuro: No focal deficits  Musculoskeletal: Muscle strength 5/5 all ext  Psychiatric: Mood and affect normal  Neck: No JVD, no carotid bruits, no thyromegaly, no lymphadenopathy.  Lungs:Clear bilaterally, no wheezes, rhonci, crackles Cardiovascular: Regular brady. No murmurs, gallops or rubs. Abdomen:Soft. Bowel sounds present. Non-tender.  Extremities: No lower extremity edema. Pulses are 2 + in the bilateral DP/PT.  EKG:  EKG is ordered today. The ekg ordered today demonstrates sinus brady  Recent Labs: No results found for requested labs within last 8760 hours.   Lipid Panel No results found for: CHOL, TRIG, HDL, CHOLHDL, VLDL, LDLCALC, LDLDIRECT   Wt Readings from Last 3 Encounters:  12/24/21 201 lb 9.6 oz (91.4 kg)  10/16/21 201 lb (91.2 kg)  11/03/20 201 lb 12.8 oz (91.5 kg)      Assessment and Plan:   1. Paroxysmal atrial fibrillation: He is in sinus bradycardia today. He has had no recurrences of atrial fib on his Apple watch since October 20, 2021. CHADS VASC score of 1 (aortic atherosclerosis on CT). Anti-coagulation is not indicated. He will start ASA 81 mg per day. I would not start the metoprolol given his low resting heart rate. If he has recurrence of atrial fib he will need a long term monitor to document.   2. Aortic valve insufficiency: Moderate by echo at [facility] November 2022. Repeat echo here in May 2023.   Current medicines are reviewed at length with the patient today.  The patient does not have concerns regarding medicines.  The following changes have been made:  no change  Labs/ tests ordered today include:   Orders Placed This Encounter  Procedures   EKG 12-Lead   ECHOCARDIOGRAM COMPLETE     Disposition:   F/U with me in six months.    Signed, Lauree Chandler, MD 12/24/2021 9:34 AM    [facility] [place], [place],   [address] Phone: [phone]; Fax: [phone]

## 2021-12-24 ENCOUNTER — Other Ambulatory Visit: Payer: Self-pay

## 2021-12-24 ENCOUNTER — Encounter: Payer: Self-pay | Admitting: Cardiovascular Disease

## 2021-12-24 ENCOUNTER — Ambulatory Visit (INDEPENDENT_AMBULATORY_CARE_PROVIDER_SITE_OTHER): Payer: BC Managed Care – PPO | Admitting: Cardiovascular Disease

## 2021-12-24 VITALS — BP 118/64 | HR 55 | Ht 78.0 in | Wt 201.6 lb

## 2021-12-24 DIAGNOSIS — I480 Paroxysmal atrial fibrillation: Secondary | ICD-10-CM | POA: Diagnosis not present

## 2021-12-24 DIAGNOSIS — I351 Nonrheumatic aortic (valve) insufficiency: Secondary | ICD-10-CM | POA: Diagnosis not present

## 2021-12-24 MED ORDER — ASPIRIN EC 81 MG PO TBEC: 81.0000 mg | DELAYED_RELEASE_TABLET | Freq: Every day | ORAL | 3 refills | Status: DC

## 2021-12-24 NOTE — Patient Instructions (Signed)
Medication Instructions:  Your physician has recommended you make the following change in your medication:  1.) start aspirin 81 mg - one tablet daily 2.) stop metoprolol  *If you need a refill on your cardiac medications before your next appointment, please call your pharmacy*   Lab Work: none   Testing/Procedures:   ECHO IN MAY 2023 Your physician has requested that you have an echocardiogram. Echocardiography is a painless test that uses sound waves to create images of your heart. It provides your doctor with information about the size and shape of your heart and how well your hearts chambers and valves are working. This procedure takes approximately one hour. There are no restrictions for this procedure.   Follow-Up: At [facility], you and your health needs are our priority.  As part of our continuing mission to provide you with exceptional heart care, we have created designated Provider Care Teams.  These Care Teams include your primary Cardiologist (physician) and Advanced Practice Providers (APPs -  Physician Assistants and Nurse Practitioners) who all work together to provide you with the care you need, when you need it.   Your next appointment:   12 month(s)  The format for your next appointment:   In Person  Provider:   Lauree Chandler, MD   Other Instructions

## 2021-12-26 ENCOUNTER — Telehealth: Payer: Self-pay | Admitting: Cardiovascular Disease

## 2021-12-26 DIAGNOSIS — I480 Paroxysmal atrial fibrillation: Secondary | ICD-10-CM

## 2021-12-26 NOTE — Telephone Encounter (Signed)
Joseph Brennan, Can we get him set up for a 14 day Zio monitor and refer him to EP with Dr. Lennie Odor or Dr. Quentin Ore to review options for medical therapy vs ablation? His resting heart rate is low so I do not want to start the beta blocker daily. Thanks, [employer] w patient.  Reviewed order for monitor and EP consult for after monitor for PAF.  He voices understanding.  He will come by tomorrow to the office 11:30 am to have applied.  Pt appreciative for assistance.

## 2021-12-26 NOTE — Telephone Encounter (Signed)
Patient c/o Palpitations:  High priority if patient c/o lightheadedness, shortness of breath, or chest pain  How long have you had palpitations/irregular HR/ Afib? Symptoms lasted 28 minutes yesterday  Are you having the symptoms now? no  Are you currently experiencing lightheadedness, SOB or CP? Lightheaded, dizzy, fatigue  Do you have a history of afib (atrial fibrillation) or irregular heart rhythm? yes  Have you checked your BP or HR? (document readings if available):   After symptoms resolved his BP was 108/68  Patient said his Apple Watch told him his HR was over 160 the entire time  Are you experiencing any other symptoms? No  Patient saw Dr. Angelena Form Monday and was told to advise him of any other spells. This spell he had was the longest he had lately

## 2021-12-26 NOTE — Telephone Encounter (Signed)
Pt called to follow up from his recent OV. He had an episode of rapid HR last night about 5:30 pm while sitting and relaxing... it lasted 28 min and his HR was in the 140's but had jumped up to 160... he felt his heart beating hard and he was light headed... it resolved on its own.. he has been well since/// his Normal resting HR is ion the 50's.   He was to let Dr. Angelena Form know after his visit Monday 12/24/21.

## 2021-12-27 ENCOUNTER — Other Ambulatory Visit: Payer: Self-pay

## 2021-12-27 ENCOUNTER — Ambulatory Visit (INDEPENDENT_AMBULATORY_CARE_PROVIDER_SITE_OTHER): Payer: BC Managed Care – PPO

## 2021-12-27 DIAGNOSIS — I480 Paroxysmal atrial fibrillation: Secondary | ICD-10-CM | POA: Diagnosis not present

## 2021-12-27 NOTE — Progress Notes (Unsigned)
Applied a 14 day Zio XT monitor to patient in the office ?

## 2022-02-11 ENCOUNTER — Other Ambulatory Visit: Payer: Self-pay

## 2022-02-11 ENCOUNTER — Encounter: Payer: Self-pay | Admitting: Cardiology

## 2022-02-11 ENCOUNTER — Ambulatory Visit (INDEPENDENT_AMBULATORY_CARE_PROVIDER_SITE_OTHER): Payer: BC Managed Care – PPO | Admitting: Cardiology

## 2022-02-11 ENCOUNTER — Encounter: Payer: Self-pay | Admitting: *Deleted

## 2022-02-11 VITALS — BP 118/78 | HR 68 | Ht 78.0 in | Wt 196.8 lb

## 2022-02-11 DIAGNOSIS — I480 Paroxysmal atrial fibrillation: Secondary | ICD-10-CM

## 2022-02-11 DIAGNOSIS — Z01818 Encounter for other preprocedural examination: Secondary | ICD-10-CM | POA: Diagnosis not present

## 2022-02-11 MED ORDER — APIXABAN 5 MG PO TABS: 5.0000 mg | ORAL_TABLET | Freq: Two times a day (BID) | ORAL | 5 refills | Status: DC

## 2022-02-11 NOTE — Progress Notes (Signed)
Electrophysiology Office Note:    Date:  02/11/2022   ID:  Joseph Brennan, DOB 1961-04-29, MRN [id number]  PCP:  Joseph Margarita, DO  [facility] Cardiologist:  None  [facility] Electrophysiologist:  None   Referring MD: Joseph Brennan*   Chief Complaint: PAF  History of Present Illness:    Joseph Brennan is a 61 y.o. male who presents for an evaluation of paroxysmal atrial fibrillation at the request of Dr. Angelena Brennan. Their medical history includes atrial fibrillation, GERD, and arthritis.  He was last seen by Dr. Angelena Brennan on 12/24/2021 where he was feeling well. At that visit he was in sinus bradycardia. He had no recurrences of Afib on his Apple watch since 10/20/21.  Mr. Joseph Brennan called the office 12/26/2021 and reported a 28 minute episode with HR >160 bpm per his Apple watch. This occurred while sitting and relaxing. He noted associated palpitations, lightheadedness, dizziness, and fatigue. A 14-day Zio monitor was ordered, and he was referred to EP to discuss options for medical therapy vs ablation.  Today he is accompanied by his wife. Prior to 08/2021 he denies any significant arrhythmias/symptoms that concerned him. Since COVID restrictions began he has had more difficulty with handling stress.  Last week he had 3 episodes of Afib. The length and severity of his episodes have been increasing. During an episode he complains of fatigue, lightheadedness, and dizziness. His symptoms occur suddenly and he needs to sit down immediately. While in Afib, walking across a room is difficult. His fatigue is often persistent into the next 1-2 days.  He has been taking metoprolol as needed. This past Thursday and Friday he noticed a resting heart rate in the 40s after taking metoprolol. His typical heart rate is in the 50s-60s.   His wife notes that he used to have issues with snoring when he was drinking alcohol more often, but this has generally resolved lately. For the past 3 years he  has avoided alcohol. He may have 2 cups of coffee a week.  He denies any chest pain, shortness of breath, or peripheral edema. No headaches, syncope, orthopnea, or PND.     Past Medical History:  Diagnosis Date   A-fib [facility])    Anxiety    Arthritis    Chest pain    Diverticulitis    August 2021   Diverticulosis    Fatigue    GERD (gastroesophageal reflux disease)    Hand arthritis    Hematuria    Insomnia    Lightheadedness     Past Surgical History:  Procedure Laterality Date   ADENOIDECTOMY     ESOPHAGEAL DILATION     right knee arthroscopy     Dr. Onnie Graham for mensicus injury   UPPER GASTROINTESTINAL ENDOSCOPY  2011    Current Medications: Current Meds  Medication Sig   apixaban (ELIQUIS) 5 MG TABS tablet Take 1 tablet (5 mg total) by mouth 2 (two) times daily.   Ascorbic Acid (VITAMIN C) 100 MG tablet Take 100 mg by mouth daily.   b complex vitamins capsule Take 1 capsule by mouth daily.   cholecalciferol (VITAMIN D3) 25 MCG (1000 UNIT) tablet Take 1,000 Units by mouth daily.   Famotidine (PEPCID AC PO) Take 1 tablet by mouth as needed (take at bedtime for acid reflux).   MAGNESIUM PO Take 1 tablet by mouth daily.   Menaquinone-7 (VITAMIN K2) 100 MCG CAPS    MULTIPLE VITAMIN PO Take 1 tablet by mouth daily.   Omega-3 Fatty  Acids (FISH OIL PO) Take 1 tablet by mouth daily.   Omeprazole (PRILOSEC PO) Take 10 mg by mouth daily.   Psyllium (METAMUCIL FIBER PO) Take 1 Scoop by mouth daily.   Taurine 1000 MG CAPS    Zinc Sulfate (ZINC 15 PO) Take 1 tablet by mouth daily.   [DISCONTINUED] aspirin EC 81 MG tablet Take 1 tablet (81 mg total) by mouth daily. Swallow whole.     Allergies:   Patient has no known allergies.   Social History   Socioeconomic History   Marital status: Married    Spouse name: Not on file   Number of children: 2   Years of education: Not on file   Highest education level: Not on file  Occupational History   Occupation: Music therapist   Tobacco Use   Smoking status: Never   Smokeless tobacco: Never  Vaping Use   Vaping Use: Never used  Substance and Sexual Activity   Alcohol use: Not Currently   Drug use: No   Sexual activity: Yes    Partners: Female    Birth control/protection: None    Comment: married  Other Topics Concern   Not on file  Social History Narrative   Not on file   Social Determinants of Health   Financial Resource Strain: Not on file  Food Insecurity: Not on file  Transportation Needs: Not on file  Physical Activity: Not on file  Stress: Not on file  Social Connections: Not on file     Family History: The patient's family history includes Breast cancer in his mother; Diabetes in his father; Healthy in his brother, brother, and sister; Heart disease in his father, maternal grandfather, and paternal grandfather; Prostate cancer in his father; Skin cancer in his father. There is no history of Colon cancer, Pancreatic cancer, Liver disease, Esophageal cancer, Stomach cancer, Colon polyps, or Rectal cancer.  ROS:   Please see the history of present illness.    (+) Lightheadedness (+) Dizziness (+) Fatigue (+) Stress All other systems reviewed and are negative.  EKGs/Labs/Other Studies Reviewed:    The following studies were reviewed today:  Monitor 01/2022: Patch Wear Time:  14 days and 0 hours (2023-01-12T11:44:54-0500 to 2023-01-26T11:44:58-0500)   Sinus rhythm. Minimum HR of 36 bpm, max HR of 250 bpm, and avg HR of 67 bpm.  57 Supraventricular Tachycardia runs occurred, the run with the fastest interval lasting 13.0 secs with a max rate of 250 bpm (avg  160 bpm); the run with the fastest interval was also the longest.  Atrial Flutter occurred (<1% burden), ranging from 67-142 bpm (avg of 106 bpm), the longest lasting 1 min 7 secs with an avg rate of 123 bpm Rare premature atrial contractions  PCV Echo 10/26/2021: Echocardiogram 10/26/2021:  Normal LV systolic function with visual  EF 60-65%. Left ventricle cavity  is normal in size. Normal left ventricular wall thickness. Normal global  wall motion. Normal diastolic filling pattern, normal LAP.  Moderate (Grade II) aortic regurgitation.  Mild (Grade I) mitral regurgitation.  No prior study for comparison.   EKG:   EKG is personally reviewed.  02/11/2022: EKG was not ordered.   Recent Labs: No results found for requested labs within last 8760 hours.   Recent Lipid Panel No results found for: CHOL, TRIG, HDL, CHOLHDL, VLDL, LDLCALC, LDLDIRECT  Physical Exam:    VS:  BP 118/78    Pulse 68    Ht 6\' 6"  (9.407 m)    Wt 196 lb  12.8 oz (89.3 kg)    SpO2 99%    BMI 22.74 kg/m     Wt Readings from Last 3 Encounters:  02/11/22 196 lb 12.8 oz (89.3 kg)  12/24/21 201 lb 9.6 oz (91.4 kg)  10/16/21 201 lb (91.2 kg)     GEN: Well nourished, well developed in no acute distress HEENT: Normal NECK: No JVD; No carotid bruits LYMPHATICS: No lymphadenopathy CARDIAC: RRR, no murmurs, rubs, gallops RESPIRATORY:  Clear to auscultation without rales, wheezing or rhonchi  ABDOMEN: Soft, non-tender, non-distended MUSCULOSKELETAL:  No edema; No deformity  SKIN: Warm and dry NEUROLOGIC:  Alert and oriented x 3 PSYCHIATRIC:  Normal affect       ASSESSMENT:    1. Pre-op evaluation   2. Paroxysmal atrial fibrillation (HCC)    PLAN:    In order of problems listed above:  #Paroxysmal atrial fibrillation Highly symptomatic paroxysms of atrial fibrillation.  I discussed treatment options with the patient including antiarrhythmic drug therapy and catheter ablation.  Given his young age and lack of significant comorbidities, I do think he is an excellent candidate for upfront ablation.  I discussed the catheter ablation in detail with the patient including the risks, recovery and likelihood of success.  I discussed the potential need for future repeat ablations or antiarrhythmic drug therapy after an initial ablation attempt.   He would like to proceed with scheduling.  He will need to start Eliquis at least 4 weeks prior to the ablation date.  We will continue the Eliquis for at least 3 months following the ablation.  Risk, benefits, and alternatives to EP study and radiofrequency ablation for afib were also discussed in detail today. These risks include but are not limited to stroke, bleeding, vascular damage, tamponade, perforation, damage to the esophagus, lungs, and other structures, pulmonary vein stenosis, worsening renal function, and death. The patient understands these risk and wishes to proceed.  We will therefore proceed with catheter ablation at the next available time.  Carto, ICE, anesthesia are requested for the procedure.  Will also obtain CT PV protocol prior to the procedure to exclude LAA thrombus and further evaluate atrial anatomy.  He will also need an echo prior to the procedure.   Total time spent with patient today 60 minutes. This includes reviewing records, evaluating the patient and coordinating care.  Medication Adjustments/Labs and Tests Ordered: Current medicines are reviewed at length with the patient today.  Concerns regarding medicines are outlined above.  Orders Placed This Encounter  Procedures   CT CARDIAC MORPH/PULM VEIN W/CM&W/O CA SCORE   CBC w/Diff   Basic Metabolic Panel (BMET)   Meds ordered this encounter  Medications   apixaban (ELIQUIS) 5 MG TABS tablet    Sig: Take 1 tablet (5 mg total) by mouth 2 (two) times daily.    Dispense:  60 tablet    Refill:  5    I,Mathew Stumpf,acting as a scribe for Vickie Epley, MD.,have documented all relevant documentation on the behalf of Vickie Epley, MD,as directed by  Vickie Epley, MD while in the presence of Vickie Epley, MD.  I, Vickie Epley, MD, have reviewed all documentation for this visit. The documentation on 02/11/22 for the exam, diagnosis, procedures, and orders are all accurate and  complete.   Signed, Hilton Cork. Quentin Ore, MD, [facility], [facility] 02/11/2022 9:48 PM    Electrophysiology [facility]

## 2022-02-11 NOTE — Patient Instructions (Addendum)
Medication Instructions:  Start Eliquis on April 14 Stop Aspirin when you start Eliquis Your physician recommends that you continue on your current medications as directed. Please refer to the Current Medication list given to you today. *If you need a refill on your cardiac medications before your next appointment, please call your pharmacy*  Lab Work: None. If you have labs (blood work) drawn today and your tests are completely normal, you will receive your results only by: [place] (if you have MyChart) OR A paper copy in the mail If you have any lab test that is abnormal or we need to change your treatment, we will call you to review the results.  Testing/Procedures: Please move Echo up ~1 week.  Your physician has requested that you have cardiac CT. Cardiac computed tomography (CT) is a painless test that uses an x-ray machine to take clear, detailed pictures of your heart. For further information please visit [email or web address]. Please follow instruction sheet as given.  Your physician has recommended that you have an ablation. Catheter ablation is a medical procedure used to treat some cardiac arrhythmias (irregular heartbeats). During catheter ablation, a long, thin, flexible tube is put into a blood vessel in your groin (upper thigh), or neck. This tube is called an ablation catheter. It is then guided to your heart through the blood vessel. Radio frequency waves destroy small areas of heart tissue where abnormal heartbeats may cause an arrhythmia to start. Please see the instruction sheet given to you today.   Follow-Up: At [facility], you and your health needs are our priority.  As part of our continuing mission to provide you with exceptional heart care, we have created designated Provider Care Teams.  These Care Teams include your primary Cardiologist (physician) and Advanced Practice Providers (APPs -  Physician Assistants and Nurse Practitioners) who all work together to  provide you with the care you need, when you need it.  Your physician wants you to follow-up in: see instruction letter.   We recommend signing up for the patient portal called "MyChart".  Sign up information is provided on this After Visit Summary.  MyChart is used to connect with patients for Virtual Visits (Telemedicine).  Patients are able to view lab/test results, encounter notes, upcoming appointments, etc.  Non-urgent messages can be sent to your provider as well.   To learn more about what you can do with MyChart, go to [email or web address].    Any Other Special Instructions Will Be Listed Below (If Applicable).  Cardiac Ablation Cardiac ablation is a procedure to destroy (ablate) some heart tissue that is sending bad signals. These bad signals cause problems in heart rhythm. The heart has many areas that make these signals. If there are problems in these areas, they can make the heart beat in a way that is not normal. Destroying some tissues can help make the heart rhythm normal. Tell your doctor about: Any allergies you have. All medicines you are taking. These include vitamins, herbs, eye drops, creams, and over-the-counter medicines. Any problems you or family members have had with medicines that make you fall asleep (anesthetics). Any blood disorders you have. Any surgeries you have had. Any medical conditions you have, such as kidney failure. Whether you are pregnant or may be pregnant. What are the risks? This is a safe procedure. But problems may occur, including: Infection. Bruising and bleeding. Bleeding into the chest. Stroke or blood clots. Damage to nearby areas of your body. Allergies to medicines or dyes. The need  for a pacemaker if the normal system is damaged. Failure of the procedure to treat the problem. What happens before the procedure? Medicines Ask your doctor about: Changing or stopping your normal medicines. This is important. Taking aspirin and  ibuprofen. Do not take these medicines unless your doctor tells you to take them. Taking other medicines, vitamins, herbs, and supplements. General instructions Follow instructions from your doctor about what you cannot eat or drink. Plan to have someone take you home from the hospital or clinic. If you will be going home right after the procedure, plan to have someone with you for 24 hours. Ask your doctor what steps will be taken to prevent infection. What happens during the procedure?  An IV tube will be put into one of your veins. You will be given a medicine to help you relax. The skin on your neck or groin will be numbed. A cut (incision) will be made in your neck or groin. A needle will be put through your cut and into a large vein. A tube (catheter) will be put into the needle. The tube will be moved to your heart. Dye may be put through the tube. This helps your doctor see your heart. Small devices (electrodes) on the tube will send out signals. A type of energy will be used to destroy some heart tissue. The tube will be taken out. Pressure will be held on your cut. This helps stop bleeding. A bandage will be put over your cut. The exact procedure may vary among doctors and hospitals. What happens after the procedure? You will be watched until you leave the hospital or clinic. This includes checking your heart rate, breathing rate, oxygen, and blood pressure. Your cut will be watched for bleeding. You will need to lie still for a few hours. Do not drive for 24 hours or as long as your doctor tells you. Summary Cardiac ablation is a procedure to destroy some heart tissue. This is done to treat heart rhythm problems. Tell your doctor about any medical conditions you may have. Tell him or her about all medicines you are taking to treat them. This is a safe procedure. But problems may occur. These include infection, bruising, bleeding, and damage to nearby areas of your body. Follow  what your doctor tells you about food and drink. You may also be told to change or stop some of your medicines. After the procedure, do not drive for 24 hours or as long as your doctor tells you. This information is not intended to replace advice given to you by your health care provider. Make sure you discuss any questions you have with your health care provider. Document Revised: 11/04/2019 Document Reviewed: 11/04/2019 Elsevier Patient Education  2022 [employer].

## 2022-03-28 ENCOUNTER — Telehealth: Payer: Self-pay

## 2022-03-28 NOTE — Telephone Encounter (Signed)
Submitted P/A on CoverMyMeds for Eliquis '5mg'$  BID.  ?KEY: [id number] ? ? ?This request has been approved using information available on the patient's profile. CaseId:[id number];Status:Approved;Review Type:Prior Auth;Coverage Start Date:02/26/2022;Coverage End Date:03/28/2023; ? ?I called to notify his pharmacy and with the approval his cost is still $547/mth. Per the pharmacist, he can use the $10 copay card.  ?I called the pt and had to leave a message but advised him to activate the $10 copay card then take to pharmacy and it should work for him.   ?

## 2022-04-02 ENCOUNTER — Other Ambulatory Visit: Payer: BC Managed Care – PPO

## 2022-04-02 DIAGNOSIS — I480 Paroxysmal atrial fibrillation: Secondary | ICD-10-CM

## 2022-04-02 DIAGNOSIS — Z01818 Encounter for other preprocedural examination: Secondary | ICD-10-CM

## 2022-04-02 LAB — BASIC METABOLIC PANEL
BUN/Creatinine Ratio: 18 (ref 9–20)
BUN: 19 mg/dL (ref 6–24)
CO2: 29 mmol/L (ref 20–29)
Calcium: 10.2 mg/dL (ref 8.7–10.2)
Chloride: 101 mmol/L (ref 96–106)
Creatinine, Ser: 1.08 mg/dL (ref 0.76–1.27)
Glucose: 84 mg/dL (ref 70–99)
Potassium: 5.6 mmol/L — ABNORMAL HIGH (ref 3.5–5.2)
Sodium: 140 mmol/L (ref 134–144)
eGFR: 80 mL/min/{1.73_m2} (ref >59)

## 2022-04-02 LAB — CBC WITH DIFFERENTIAL/PLATELET
Basophils Absolute: 0 10*3/uL (ref 0.0–0.2)
Basos: 0 %
EOS (ABSOLUTE): 0.1 10*3/uL (ref 0.0–0.4)
Eos: 1 %
Hematocrit: 44.6 % (ref 37.5–51.0)
Hemoglobin: 15.7 g/dL (ref 13.0–17.7)
Lymphocytes Absolute: 1.4 10*3/uL (ref 0.7–3.1)
Lymphs: 27 %
MCH: 31.8 pg (ref 26.6–33.0)
MCHC: 35.2 g/dL (ref 31.5–35.7)
MCV: 90 fL (ref 79–97)
Monocytes Absolute: 0.6 10*3/uL (ref 0.1–0.9)
Monocytes: 12 %
Neutrophils Absolute: 3.1 10*3/uL (ref 1.4–7.0)
Neutrophils: 60 %
Platelets: 154 10*3/uL (ref 150–450)
RBC: 4.94 x10E6/uL (ref 4.14–5.80)
RDW: 13.5 % (ref 11.6–15.4)
WBC: 5.3 10*3/uL (ref 3.4–10.8)

## 2022-04-04 ENCOUNTER — Telehealth: Payer: Self-pay | Admitting: *Deleted

## 2022-04-04 DIAGNOSIS — E875 Hyperkalemia: Secondary | ICD-10-CM

## 2022-04-04 NOTE — Telephone Encounter (Signed)
-----   Message from Vickie Epley, MD sent at 04/03/2022  7:39 PM EDT ----- ?Blood work reviewed. Potassium level elevated. I'd like to repeat this in 1 week. Otila Kluver, can you order this please?  ? ?Lysbeth Galas T. Quentin Ore, MD, [facility], [place] ?Cardiac Electrophysiology ? ? ?

## 2022-04-04 NOTE — Telephone Encounter (Signed)
The patient has been notified of the result and verbalized understanding.  All questions (if any) were answered. ?Joseph Jewel, RN 04/04/2022 2:09 PM  ?  ?Ordered lab and scheduled for next week. Patient in agreement.  ?

## 2022-04-09 ENCOUNTER — Other Ambulatory Visit: Payer: BC Managed Care – PPO | Admitting: *Deleted

## 2022-04-09 DIAGNOSIS — E875 Hyperkalemia: Secondary | ICD-10-CM

## 2022-04-09 LAB — BASIC METABOLIC PANEL
BUN/Creatinine Ratio: 19 (ref 9–20)
BUN: 18 mg/dL (ref 6–24)
CO2: 30 mmol/L — ABNORMAL HIGH (ref 20–29)
Calcium: 10.2 mg/dL (ref 8.7–10.2)
Chloride: 101 mmol/L (ref 96–106)
Creatinine, Ser: 0.97 mg/dL (ref 0.76–1.27)
Glucose: 102 mg/dL — ABNORMAL HIGH (ref 70–99)
Potassium: 4.9 mmol/L (ref 3.5–5.2)
Sodium: 139 mmol/L (ref 134–144)
eGFR: 90 mL/min/{1.73_m2} (ref >59)

## 2022-04-18 ENCOUNTER — Telehealth (HOSPITAL_COMMUNITY): Payer: Self-pay | Admitting: Emergency Medicine

## 2022-04-18 NOTE — Telephone Encounter (Signed)
Reaching out to patient to offer assistance regarding upcoming cardiac imaging study; pt verbalizes understanding of appt date/time, parking situation and where to check in, pre-test NPO status and medications ordered, and verified current allergies; name and call back number provided for further questions should they arise ?Marchia Bond RN Navigator Cardiac Imaging ?[facility] ?[phone] office ?[phone] cell ? ?Arrival time 100 ?Daily meds ? ?

## 2022-04-18 NOTE — Telephone Encounter (Signed)
Attempted to call patient regarding upcoming cardiac CT appointment. °Left message on voicemail with name and callback number °Jorah Hua RN Navigator Cardiac Imaging °[facility] °[phone] Office °[phone] Cell ° °

## 2022-04-19 ENCOUNTER — Ambulatory Visit (HOSPITAL_COMMUNITY)
Admission: RE | Admit: 2022-04-19 | Discharge: 2022-04-19 | Disposition: A | Discharge status: Home / Self Care | Payer: BC Managed Care – PPO | Source: Ambulatory Visit | Attending: Cardiology | Admitting: Cardiology

## 2022-04-19 ENCOUNTER — Ambulatory Visit (HOSPITAL_BASED_OUTPATIENT_CLINIC_OR_DEPARTMENT_OTHER): Payer: BC Managed Care – PPO

## 2022-04-19 DIAGNOSIS — I480 Paroxysmal atrial fibrillation: Secondary | ICD-10-CM

## 2022-04-19 DIAGNOSIS — I351 Nonrheumatic aortic (valve) insufficiency: Secondary | ICD-10-CM

## 2022-04-19 LAB — ECHOCARDIOGRAM COMPLETE
Area-P 1/2: 3.53 cm2
P 1/2 time: 1047 msec
S' Lateral: 3.3 cm

## 2022-04-19 MED ORDER — IOHEXOL 350 MG/ML SOLN
100.0000 mL | Freq: Once | INTRAVENOUS | Status: AC | PRN
  Administered 2022-04-19: 100 mL via INTRAVENOUS

## 2022-04-19 MED ORDER — PERFLUTREN LIPID MICROSPHERE
1.0000 mL | INTRAVENOUS | Status: AC | PRN
  Administered 2022-04-19: 2 mL via INTRAVENOUS

## 2022-04-23 ENCOUNTER — Telehealth: Payer: Self-pay | Admitting: Cardiovascular Disease

## 2022-04-23 ENCOUNTER — Other Ambulatory Visit: Payer: Self-pay | Admitting: *Deleted

## 2022-04-23 DIAGNOSIS — I351 Nonrheumatic aortic (valve) insufficiency: Secondary | ICD-10-CM

## 2022-04-23 NOTE — Telephone Encounter (Signed)
Patient returning call for echo results. 

## 2022-04-23 NOTE — Telephone Encounter (Signed)
Spoke w patient and reviewed results.  Pt appreciative for information provided. ?

## 2022-04-23 NOTE — Progress Notes (Signed)
1 year echo for mild/moderate aortic insufficiency. ?

## 2022-04-25 NOTE — Pre-Procedure Instructions (Signed)
Attempted to call patient regarding procedure instructions.  Left voice mail on the following items: Arrival time 0830 Nothing to eat or drink after midnight No meds AM of procedure Responsible person to drive you home and stay with you for 24 hrs  Have you missed any doses of anti-coagulant Eliquis- take both doses today, don't take any in the morning    

## 2022-04-26 ENCOUNTER — Encounter (HOSPITAL_COMMUNITY): Payer: Self-pay | Admitting: Cardiology

## 2022-04-26 ENCOUNTER — Ambulatory Visit (HOSPITAL_COMMUNITY): Payer: BC Managed Care – PPO | Admitting: Anesthesiology

## 2022-04-26 ENCOUNTER — Other Ambulatory Visit: Payer: Self-pay

## 2022-04-26 ENCOUNTER — Ambulatory Visit (HOSPITAL_COMMUNITY)
Admission: RE | Disposition: A | Discharge status: Home / Self Care | Payer: BC Managed Care – PPO | Source: Home / Self Care | Attending: Cardiology

## 2022-04-26 ENCOUNTER — Other Ambulatory Visit (HOSPITAL_COMMUNITY): Payer: BC Managed Care – PPO

## 2022-04-26 ENCOUNTER — Ambulatory Visit (HOSPITAL_COMMUNITY)
Admission: RE | Admit: 2022-04-26 | Discharge: 2022-04-26 | Disposition: A | Discharge status: Home / Self Care | Payer: BC Managed Care – PPO | Attending: Cardiology | Admitting: Cardiology

## 2022-04-26 ENCOUNTER — Other Ambulatory Visit (HOSPITAL_COMMUNITY): Payer: Self-pay

## 2022-04-26 DIAGNOSIS — I480 Paroxysmal atrial fibrillation: Secondary | ICD-10-CM | POA: Diagnosis not present

## 2022-04-26 DIAGNOSIS — I4891 Unspecified atrial fibrillation: Secondary | ICD-10-CM | POA: Diagnosis not present

## 2022-04-26 DIAGNOSIS — M1990 Unspecified osteoarthritis, unspecified site: Secondary | ICD-10-CM | POA: Diagnosis not present

## 2022-04-26 DIAGNOSIS — I483 Typical atrial flutter: Secondary | ICD-10-CM | POA: Diagnosis not present

## 2022-04-26 HISTORY — PX: ATRIAL FIBRILLATION ABLATION: EP1191

## 2022-04-26 LAB — POCT ACTIVATED CLOTTING TIME
Activated Clotting Time: 311 seconds
Activated Clotting Time: 347 seconds

## 2022-04-26 SURGERY — ATRIAL FIBRILLATION ABLATION
Anesthesia: General

## 2022-04-26 MED ORDER — DEXAMETHASONE SODIUM PHOSPHATE 10 MG/ML IJ SOLN
INTRAMUSCULAR | Status: DC | PRN
  Administered 2022-04-26: 10 mg via INTRAVENOUS

## 2022-04-26 MED ORDER — ROCURONIUM BROMIDE 10 MG/ML (PF) SYRINGE
PREFILLED_SYRINGE | INTRAVENOUS | Status: DC | PRN
  Administered 2022-04-26: 10 mg via INTRAVENOUS
  Administered 2022-04-26: 20 mg via INTRAVENOUS
  Administered 2022-04-26: 50 mg via INTRAVENOUS
  Administered 2022-04-26: 30 mg via INTRAVENOUS

## 2022-04-26 MED ORDER — ISOPROTERENOL HCL 0.2 MG/ML IJ SOLN
INTRAMUSCULAR | Status: DC | PRN
  Administered 2022-04-26: 4 ug/min via INTRAVENOUS

## 2022-04-26 MED ORDER — HEPARIN (PORCINE) IN NACL 1000-0.9 UT/500ML-% IV SOLN
INTRAVENOUS | Status: AC
  Filled 2022-04-26: qty 500

## 2022-04-26 MED ORDER — SUGAMMADEX SODIUM 200 MG/2ML IV SOLN
INTRAVENOUS | Status: DC | PRN
  Administered 2022-04-26: 200 mg via INTRAVENOUS

## 2022-04-26 MED ORDER — COLCHICINE 0.6 MG PO TABS
0.6000 mg | ORAL_TABLET | Freq: Two times a day (BID) | ORAL | 0 refills | Status: DC
  Filled 2022-04-26: qty 10, 5d supply, fill #0

## 2022-04-26 MED ORDER — PHENYLEPHRINE 80 MCG/ML (10ML) SYRINGE FOR IV PUSH (FOR BLOOD PRESSURE SUPPORT)
PREFILLED_SYRINGE | INTRAVENOUS | Status: DC | PRN
  Administered 2022-04-26: 160 ug via INTRAVENOUS
  Administered 2022-04-26 (×5): 80 ug via INTRAVENOUS
  Administered 2022-04-26: 160 ug via INTRAVENOUS

## 2022-04-26 MED ORDER — SODIUM CHLORIDE 0.9% FLUSH: 3.0000 mL | INTRAVENOUS | Status: DC | PRN

## 2022-04-26 MED ORDER — ONDANSETRON HCL 4 MG/2ML IJ SOLN
INTRAMUSCULAR | Status: DC | PRN
  Administered 2022-04-26: 4 mg via INTRAVENOUS

## 2022-04-26 MED ORDER — PANTOPRAZOLE SODIUM 40 MG PO TBEC
40.0000 mg | DELAYED_RELEASE_TABLET | Freq: Every day | ORAL | Status: DC
  Administered 2022-04-26: 40 mg via ORAL
  Filled 2022-04-26: qty 1

## 2022-04-26 MED ORDER — COLCHICINE 0.6 MG PO TABS
0.6000 mg | ORAL_TABLET | Freq: Two times a day (BID) | ORAL | Status: DC
  Administered 2022-04-26: 0.6 mg via ORAL
  Filled 2022-04-26: qty 1

## 2022-04-26 MED ORDER — PROPOFOL 10 MG/ML IV BOLUS
INTRAVENOUS | Status: DC | PRN
  Administered 2022-04-26: 150 mg via INTRAVENOUS

## 2022-04-26 MED ORDER — ISOPROTERENOL HCL 0.2 MG/ML IJ SOLN
INTRAMUSCULAR | Status: AC
  Filled 2022-04-26: qty 5

## 2022-04-26 MED ORDER — EPHEDRINE SULFATE-NACL 50-0.9 MG/10ML-% IV SOSY
PREFILLED_SYRINGE | INTRAVENOUS | Status: DC | PRN
  Administered 2022-04-26 (×2): 5 mg via INTRAVENOUS

## 2022-04-26 MED ORDER — SODIUM CHLORIDE 0.9 % IV SOLN: INTRAVENOUS | Status: DC

## 2022-04-26 MED ORDER — ACETAMINOPHEN 325 MG PO TABS
650.0000 mg | ORAL_TABLET | ORAL | Status: DC | PRN
  Filled 2022-04-26: qty 2

## 2022-04-26 MED ORDER — PROTAMINE SULFATE 10 MG/ML IV SOLN
INTRAVENOUS | Status: DC | PRN
Start: 2022-04-26 — End: 2022-04-26
  Administered 2022-04-26: 10 mg via INTRAVENOUS
  Administered 2022-04-26: 15 mg via INTRAVENOUS
  Administered 2022-04-26: 10 mg via INTRAVENOUS

## 2022-04-26 MED ORDER — MIDAZOLAM HCL 5 MG/5ML IJ SOLN
INTRAMUSCULAR | Status: DC | PRN
Start: 2022-04-26 — End: 2022-04-26
  Administered 2022-04-26: 2 mg via INTRAVENOUS

## 2022-04-26 MED ORDER — PHENYLEPHRINE HCL-NACL 20-0.9 MG/250ML-% IV SOLN
INTRAVENOUS | Status: DC | PRN
  Administered 2022-04-26: 25 ug/min via INTRAVENOUS

## 2022-04-26 MED ORDER — SODIUM CHLORIDE 0.9 % IV SOLN: 250.0000 mL | INTRAVENOUS | Status: DC | PRN

## 2022-04-26 MED ORDER — ONDANSETRON HCL 4 MG/2ML IJ SOLN: 4.0000 mg | Freq: Four times a day (QID) | INTRAMUSCULAR | Status: DC | PRN

## 2022-04-26 MED ORDER — APIXABAN 5 MG PO TABS
5.0000 mg | ORAL_TABLET | Freq: Two times a day (BID) | ORAL | Status: DC
  Administered 2022-04-26: 5 mg via ORAL

## 2022-04-26 MED ORDER — LIDOCAINE 2% (20 MG/ML) 5 ML SYRINGE
INTRAMUSCULAR | Status: DC | PRN
  Administered 2022-04-26: 80 mg via INTRAVENOUS

## 2022-04-26 MED ORDER — HEPARIN SODIUM (PORCINE) 1000 UNIT/ML IJ SOLN
INTRAMUSCULAR | Status: DC | PRN
Start: 2022-04-26 — End: 2022-04-26
  Administered 2022-04-26: 14000 [IU] via INTRAVENOUS
  Administered 2022-04-26: 4000 [IU] via INTRAVENOUS

## 2022-04-26 MED ORDER — HEPARIN (PORCINE) IN NACL 2-0.9 UNITS/ML
INTRAMUSCULAR | Status: AC | PRN
  Administered 2022-04-26 (×4): 500 mL

## 2022-04-26 MED ORDER — HEPARIN SODIUM (PORCINE) 1000 UNIT/ML IJ SOLN
INTRAMUSCULAR | Status: AC
  Filled 2022-04-26: qty 10

## 2022-04-26 MED ORDER — SODIUM CHLORIDE 0.9% FLUSH: 3.0000 mL | Freq: Two times a day (BID) | INTRAVENOUS | Status: DC

## 2022-04-26 MED ORDER — HEPARIN SODIUM (PORCINE) 1000 UNIT/ML IJ SOLN
INTRAMUSCULAR | Status: DC | PRN
  Administered 2022-04-26: 1000 [IU] via INTRAVENOUS

## 2022-04-26 MED ORDER — FENTANYL CITRATE (PF) 250 MCG/5ML IJ SOLN
INTRAMUSCULAR | Status: DC | PRN
  Administered 2022-04-26: 100 ug via INTRAVENOUS

## 2022-04-26 MED ORDER — PANTOPRAZOLE SODIUM 40 MG PO TBEC
40.0000 mg | DELAYED_RELEASE_TABLET | Freq: Every day | ORAL | 0 refills | Status: DC
  Filled 2022-04-26: qty 30, 30d supply, fill #0

## 2022-04-26 SURGICAL SUPPLY — 20 items
BLANKET WARM UNDERBOD FULL ACC (MISCELLANEOUS) ×2 IMPLANT
CATH OCTARAY 2.0 F 3-3-3-3-3 (CATHETERS) ×1 IMPLANT
CATH S CIRCA THERM PROBE 10F (CATHETERS) ×1 IMPLANT
CATH SMTCH THERMOCOOL SF DF (CATHETERS) ×1 IMPLANT
CATH SOUNDSTAR ECO 8FR (CATHETERS) ×1 IMPLANT
CATH WEBSTER BI DIR CS D-F CRV (CATHETERS) ×1 IMPLANT
CLOSURE PERCLOSE PROSTYLE (VASCULAR PRODUCTS) ×3 IMPLANT
COVER SWIFTLINK CONNECTOR (BAG) ×2 IMPLANT
MAT PREVALON FULL STRYKER (MISCELLANEOUS) ×1 IMPLANT
PACK EP LATEX FREE (CUSTOM PROCEDURE TRAY) ×1
PACK EP LF (CUSTOM PROCEDURE TRAY) ×1 IMPLANT
PAD DEFIB RADIO PHYSIO CONN (PAD) ×2 IMPLANT
PATCH CARTO3 (PAD) ×1 IMPLANT
SHEATH BAYLIS TRANSSEPTAL 98CM (NEEDLE) ×1 IMPLANT
SHEATH CARTO VIZIGO SM CVD (SHEATH) ×1 IMPLANT
SHEATH PINNACLE 8F 10CM (SHEATH) ×2 IMPLANT
SHEATH PINNACLE 9F 10CM (SHEATH) ×1 IMPLANT
SHEATH PINNACLE VASC 9FR (SHEATH) IMPLANT
SHEATH PROBE COVER 6X72 (BAG) ×1 IMPLANT
TUBING SMART ABLATE COOLFLOW (TUBING) ×1 IMPLANT

## 2022-04-26 NOTE — Discharge Instructions (Addendum)
Post procedure care instructions ?No driving for 4 days. No lifting over 5 lbs for 1 week. No vigorous or sexual activity for 1 week. You may return to work/your usual activities on 05/04/22. Keep procedure site clean & dry. If you notice increased pain, swelling, bleeding or pus, call/return!  You may shower after 24 hours, but no soaking in baths/hot tubs/pools for 1 week. \ ? ? ?You have an appointment set up with the [facility].  Multiple studies have shown that being followed by a dedicated atrial fibrillation clinic in addition to the standard care you receive from your other physicians improves health. We believe that enrollment in the atrial fibrillation clinic will allow [place] to better care for you.  ? ?The phone number to the [facility] is [phone]. The clinic is staffed Monday through Friday from 8:30am to 5pm. ? ?Parking Directions: The clinic is located in the Heart and Vascular Building connected to [facility]. ?1)From [address] turn on to [address] and go to the 3rd entrance  (Heart and Vascular entrance) on the right. ?2)Look to the right for Heart &Vascular Parking Garage. ?3)A code for the entrance is required, for June is 1004.   ?4)Take the elevators to the 1st floor. Registration is in the room with the glass walls at the end of the hallway. ? ?If you have any trouble parking or locating the clinic, please don?t hesitate to call [phone].  ? ?Cardiac Ablation, Care After ? ?This sheet gives you information about how to care for yourself after your procedure. Your health care provider may also give you more specific instructions. If you have problems or questions, contact your health care provider. ?What can I expect after the procedure? ?After the procedure, it is common to have: ?Bruising around your puncture site. ?Tenderness around your puncture site. ?Skipped heartbeats. ?Tiredness (fatigue). ? ?Follow these instructions at  home: ?Puncture site care  ?Follow instructions from your health care provider about how to take care of your puncture site. Make sure you: ?If present, leave stitches (sutures), skin glue, or adhesive strips in place. These skin closures may need to stay in place for up to 2 weeks. If adhesive strip edges start to loosen and curl up, you may trim the loose edges. Do not remove adhesive strips completely unless your health care provider tells you to do that. ?If a large square bandage is present, this may be removed 24 hours after surgery.  ?Check your puncture site every day for signs of infection. Check for: ?Redness, swelling, or pain. ?Fluid or blood. If your puncture site starts to bleed, lie down on your back, apply firm pressure to the area, and contact your health care provider. ?Warmth. ?Pus or a bad smell. ?A pea or small marble sized lump at the site is normal and can take up to three months to resolve.  ?Driving ?Do not drive for at least 4 days after your procedure or however long your health care provider recommends. (Do not resume driving if you have previously been instructed not to drive for other health reasons.) ?Do not drive or use heavy machinery while taking prescription pain medicine. ?Activity ?Avoid activities that take a lot of effort for at least 7 days after your procedure. ?Do not lift anything that is heavier than 5 lb (4.5 kg) for one week.  ?No sexual activity for 1 week.  ?Return to your normal activities as told by your health care provider. Ask your health  care provider what activities are safe for you. ?General instructions ?Take over-the-counter and prescription medicines only as told by your health care provider. ?Do not use any products that contain nicotine or tobacco, such as cigarettes and e-cigarettes. If you need help quitting, ask your health care provider. ?You may shower after 24 hours, but Do not take baths, swim, or use a hot tub for 1 week.  ?Do not drink alcohol for  24 hours after your procedure. ?Keep all follow-up visits as told by your health care provider. This is important. ?Contact a health care provider if: ?You have redness, mild swelling, or pain around your puncture site. ?You have fluid or blood coming from your puncture site that stops after applying firm pressure to the area. ?Your puncture site feels warm to the touch. ?You have pus or a bad smell coming from your puncture site. ?You have a fever. ?You have chest pain or discomfort that spreads to your neck, jaw, or arm. ?You are sweating a lot. ?You feel nauseous. ?You have a fast or irregular heartbeat. ?You have shortness of breath. ?You are dizzy or light-headed and feel the need to lie down. ?You have pain or numbness in the arm or leg closest to your puncture site. ?Get help right away if: ?Your puncture site suddenly swells. ?Your puncture site is bleeding and the bleeding does not stop after applying firm pressure to the area. ?These symptoms may represent a serious problem that is an emergency. Do not wait to see if the symptoms will go away. Get medical help right away. Call your local emergency services (911 in the [place] Do not drive yourself to the hospital. ?Summary ?After the procedure, it is normal to have bruising and tenderness at the puncture site in your groin, neck, or forearm. ?Check your puncture site every day for signs of infection. ?Get help right away if your puncture site is bleeding and the bleeding does not stop after applying firm pressure to the area. This is a medical emergency. ?This information is not intended to replace advice given to you by your health care provider. Make sure you discuss any questions you have with your health care provider. ? ? ?

## 2022-04-26 NOTE — Anesthesia Postprocedure Evaluation (Signed)
Anesthesia Post Note ? ?Patient: Joseph Brennan ? ?Procedure(s) Performed: ATRIAL FIBRILLATION ABLATION ? ?  ? ?Patient location during evaluation: PACU ?Anesthesia Type: General ?Level of consciousness: awake ?Pain management: pain level controlled ?Respiratory status: spontaneous breathing ?Cardiovascular status: stable ?Postop Assessment: no apparent nausea or vomiting ?Anesthetic complications: no ? ? ?No notable events documented. ? ?Last Vitals:  ?Vitals:  ? 04/26/22 1406 04/26/22 1421  ?BP:  115/73  ?Pulse:  72  ?Resp:  16  ?Temp: 36.7 ?C   ?SpO2:  99%  ?  ?Last Pain:  ?Vitals:  ? 04/26/22 1421  ?TempSrc:   ?PainSc: 0-No pain  ? ? ?  ?  ?  ?  ?  ?  ? ?Mirah Nevins ? ? ? ? ?

## 2022-04-26 NOTE — H&P (Signed)
?Electrophysiology Office Note:   ?  ?Date:  04/26/2022  ?  ?ID:  Joseph Brennan, DOB Jul 17, 1961, MRN [id number] ?  ?PCP:  Sueanne Margarita, DO    ?[facility] Cardiologist:  None  ?[facility] Electrophysiologist:  None  ?  ?Referring MD: Burnell Blanks*  ?  ?Chief Complaint: PAF ?  ?History of Present Illness:   ?  ?Joseph Brennan is a 61 y.o. male who presents for an evaluation of paroxysmal atrial fibrillation at the request of Dr. Angelena Form. Their medical history includes atrial fibrillation, GERD, and arthritis. ?  ?He was last seen by Dr. Angelena Form on 12/24/2021 where he was feeling well. At that visit he was in sinus bradycardia. He had no recurrences of Afib on his Apple watch since 10/20/21. ?  ?Mr. Morel called the office 12/26/2021 and reported a 28 minute episode with HR >160 bpm per his Apple watch. This occurred while sitting and relaxing. He noted associated palpitations, lightheadedness, dizziness, and fatigue. A 14-day Zio monitor was ordered, and he was referred to EP to discuss options for medical therapy vs ablation. ?  ?Today he is accompanied by his wife. Prior to 08/2021 he denies any significant arrhythmias/symptoms that concerned him. Since COVID restrictions began he has had more difficulty with handling stress. ?  ?Last week he had 3 episodes of Afib. The length and severity of his episodes have been increasing. During an episode he complains of fatigue, lightheadedness, and dizziness. His symptoms occur suddenly and he needs to sit down immediately. While in Afib, walking across a room is difficult. His fatigue is often persistent into the next 1-2 days. ?  ?He has been taking metoprolol as needed. This past Thursday and Friday he noticed a resting heart rate in the 40s after taking metoprolol. His typical heart rate is in the 50s-60s.  ?  ?His wife notes that he used to have issues with snoring when he was drinking alcohol more often, but this has generally resolved lately. For the  past 3 years he has avoided alcohol. He may have 2 cups of coffee a week. ?  ?He denies any chest pain, shortness of breath, or peripheral edema. No headaches, syncope, orthopnea, or PND. ?  ?Presents for PVI today.  ?  ?  ?Objective  ?  ?    ?Past Medical History:  ?Diagnosis Date  ? A-fib ([place])    ? Anxiety    ? Arthritis    ? Chest pain    ? Diverticulitis    ?  August 2021  ? Diverticulosis    ? Fatigue    ? GERD (gastroesophageal reflux disease)    ? Hand arthritis    ? Hematuria    ? Insomnia    ? Lightheadedness    ?  ?  ?     ?Past Surgical History:  ?Procedure Laterality Date  ? ADENOIDECTOMY      ? ESOPHAGEAL DILATION      ? right knee arthroscopy      ?  Dr. Onnie Graham for mensicus injury  ? UPPER GASTROINTESTINAL ENDOSCOPY   2011  ?  ?  ?Current Medications: ?Active Medications  ?    ?Current Meds  ?Medication Sig  ? apixaban (ELIQUIS) 5 MG TABS tablet Take 1 tablet (5 mg total) by mouth 2 (two) times daily.  ? Ascorbic Acid (VITAMIN C) 100 MG tablet Take 100 mg by mouth daily.  ? b complex vitamins capsule Take 1 capsule by mouth daily.  ?  cholecalciferol (VITAMIN D3) 25 MCG (1000 UNIT) tablet Take 1,000 Units by mouth daily.  ? Famotidine (PEPCID AC PO) Take 1 tablet by mouth as needed (take at bedtime for acid reflux).  ? MAGNESIUM PO Take 1 tablet by mouth daily.  ? Menaquinone-7 (VITAMIN K2) 100 MCG CAPS    ? MULTIPLE VITAMIN PO Take 1 tablet by mouth daily.  ? Omega-3 Fatty Acids (FISH OIL PO) Take 1 tablet by mouth daily.  ? Omeprazole (PRILOSEC PO) Take 10 mg by mouth daily.  ? Psyllium (METAMUCIL FIBER PO) Take 1 Scoop by mouth daily.  ? Taurine 1000 MG CAPS    ? Zinc Sulfate (ZINC 15 PO) Take 1 tablet by mouth daily.  ? [DISCONTINUED] aspirin EC 81 MG tablet Take 1 tablet (81 mg total) by mouth daily. Swallow whole.  ?  ?  ?  ?Allergies:   Patient has no known allergies.  ?  ?Social History  ?  ?     ?Socioeconomic History  ? Marital status: Married  ?    Spouse name: Not on file  ? Number of  children: 2  ? Years of education: Not on file  ? Highest education level: Not on file  ?Occupational History  ? Occupation: Music therapist  ?Tobacco Use  ? Smoking status: Never  ? Smokeless tobacco: Never  ?Vaping Use  ? Vaping Use: Never used  ?Substance and Sexual Activity  ? Alcohol use: Not Currently  ? Drug use: No  ? Sexual activity: Yes  ?    Partners: Female  ?    Birth control/protection: None  ?    Comment: married  ?Other Topics Concern  ? Not on file  ?Social History Narrative  ? Not on file  ?  ?Social Determinants of Health  ?  ?Financial Resource Strain: Not on file  ?Food Insecurity: Not on file  ?Transportation Needs: Not on file  ?Physical Activity: Not on file  ?Stress: Not on file  ?Social Connections: Not on file  ?  ?  ?Family History: ?The patient's family history includes Breast cancer in his mother; Diabetes in his father; Healthy in his brother, brother, and sister; Heart disease in his father, maternal grandfather, and paternal grandfather; Prostate cancer in his father; Skin cancer in his father. There is no history of Colon cancer, Pancreatic cancer, Liver disease, Esophageal cancer, Stomach cancer, Colon polyps, or Rectal cancer. ?  ?ROS:   ?Please see the history of present illness.    ?(+) Lightheadedness ?(+) Dizziness ?(+) Fatigue ?(+) Stress ?All other systems reviewed and are negative. ?  ?EKGs/Labs/Other Studies Reviewed:   ?  ?The following studies were reviewed today: ?  ?Monitor 01/2022: ?Patch Wear Time:  14 days and 0 hours (2023-01-12T11:44:54-0500 to 2023-01-26T11:44:58-0500) ?  ?Sinus rhythm. Minimum HR of 36 bpm, max HR of 250 bpm, and avg HR of 67 bpm.  ?57 Supraventricular Tachycardia runs occurred, the run with the fastest interval lasting 13.0 secs with a max rate of 250 bpm (avg  ?160 bpm); the run with the fastest interval was also the longest.  ?Atrial Flutter occurred (<1% burden), ranging from 67-142 bpm (avg of 106 bpm), the longest lasting 1 min 7 secs  with an avg rate of 123 bpm ?Rare premature atrial contractions ?  ?PCV Echo 10/26/2021: ?Echocardiogram 10/26/2021:  ?Normal LV systolic function with visual EF 60-65%. Left ventricle cavity  ?is normal in size. Normal left ventricular wall thickness. Normal global  ?wall motion. Normal diastolic filling  pattern, normal LAP.  ?Moderate (Grade II) aortic regurgitation.  ?Mild (Grade I) mitral regurgitation.  ?No prior study for comparison. ?  ?  ?EKG:   EKG is personally reviewed.  ?02/11/2022: EKG was not ordered. ?  ?  ?Recent Labs: ?No results found for requested labs within last 8760 hours.  ?  ?Recent Lipid Panel ?Labs (Brief)  ?No results found for: CHOL, TRIG, HDL, CHOLHDL, VLDL, LDLCALC, LDLDIRECT  ? ?  ?Physical Exam:   ?  ?VS:  BP 124/85   Pulse 65   Ht '6\' 6"'$  (1.981 m)   Wt 196 lb 12.8 oz (89.3 kg)   SpO2 99%   BMI 22.74 kg/m?    ?  ?   ?Wt Readings from Last 3 Encounters:  ?02/11/22 196 lb 12.8 oz (89.3 kg)  ?12/24/21 201 lb 9.6 oz (91.4 kg)  ?10/16/21 201 lb (91.2 kg)  ?  ?  ?GEN: Well nourished, well developed in no acute distress ?HEENT: Normal ?NECK: No JVD; No carotid bruits ?LYMPHATICS: No lymphadenopathy ?CARDIAC: RRR, no murmurs, rubs, gallops ?RESPIRATORY:  Clear to auscultation without rales, wheezing or rhonchi  ?ABDOMEN: Soft, non-tender, non-distended ?MUSCULOSKELETAL:  No edema; No deformity  ?SKIN: Warm and dry ?NEUROLOGIC:  Alert and oriented x 3 ?PSYCHIATRIC:  Normal affect  ?  ?  ?  ?  ?Assessment ?  ?  ?ASSESSMENT:   ?  ?1. Pre-op evaluation   ?2. Paroxysmal atrial fibrillation (HCC)   ?  ?PLAN:   ?  ?In order of problems listed above: ?  ?#Paroxysmal atrial fibrillation ?Highly symptomatic paroxysms of atrial fibrillation.  I discussed treatment options with the patient including antiarrhythmic drug therapy and catheter ablation.  Given his young age and lack of significant comorbidities, I do think he is an excellent candidate for upfront ablation.  I discussed the catheter  ablation in detail with the patient including the risks, recovery and likelihood of success.  I discussed the potential need for future repeat ablations or antiarrhythmic drug therapy after an initial ablation attempt.  H

## 2022-04-26 NOTE — Transfer of Care (Signed)
Immediate Anesthesia Transfer of Care Note ? ?Patient: Joseph Brennan ? ?Procedure(s) Performed: ATRIAL FIBRILLATION ABLATION ? ?Patient Location: PACU ? ?Anesthesia Type:General ? ?Level of Consciousness: awake, oriented and patient cooperative ? ?Airway & Oxygen Therapy: Patient Spontanous Breathing and Patient connected to nasal cannula oxygen ? ?Post-op Assessment: Report given to RN and Post -op Vital signs reviewed and stable ? ?Post vital signs: Reviewed ? ?Last Vitals:  ?Vitals Value Taken Time  ?BP 113/61 04/26/22 1340  ?Temp 36.7 ?C 04/26/22 1336  ?Pulse 74 04/26/22 1344  ?Resp 13 04/26/22 1344  ?SpO2 99 % 04/26/22 1344  ?Vitals shown include unvalidated device data. ? ?Last Pain:  ?Vitals:  ? 04/26/22 1336  ?TempSrc: Temporal  ?PainSc: 0-No pain  ?   ? ?  ? ?Complications: No notable events documented. ?

## 2022-04-26 NOTE — Anesthesia Procedure Notes (Signed)
Procedure Name: Intubation ?Date/Time: 04/26/2022 11:05 AM ?Performed by: Jenne Campus, CRNA ?Pre-anesthesia Checklist: Patient identified, Emergency Drugs available, Suction available and Patient being monitored ?Patient Re-evaluated:Patient Re-evaluated prior to induction ?Oxygen Delivery Method: Circle System Utilized ?Preoxygenation: Pre-oxygenation with 100% oxygen ?Induction Type: IV induction ?Ventilation: Mask ventilation without difficulty ?Laryngoscope Size: Sabra Heck and 3 ?Grade View: Grade I ?Tube type: Oral ?Tube size: 8.0 mm ?Number of attempts: 1 ?Airway Equipment and Method: Stylet and Oral airway ?Placement Confirmation: ETT inserted through vocal cords under direct vision, positive ETCO2 and breath sounds checked- equal and bilateral ?Secured at: 24 cm ?Tube secured with: Tape ?Dental Injury: Teeth and Oropharynx as per pre-operative assessment  ? ? ? ? ?

## 2022-04-26 NOTE — Anesthesia Preprocedure Evaluation (Addendum)
Anesthesia Evaluation  ?Patient identified by MRN, date of birth, ID band ?Patient awake ? ? ? ?Reviewed: ?Allergy & Precautions, NPO status , Patient's Chart, lab work & pertinent test results ? ?Airway ?Mallampati: II ? ?TM Distance: >3 FB ?Neck ROM: Full ? ? ? Dental ? ?(+) Teeth Intact, Dental Advisory Given ?  ?Pulmonary ?neg pulmonary ROS,  ?  ?breath sounds clear to auscultation ? ? ? ? ? ? Cardiovascular ?negative cardio ROS ? ? ?Rhythm:Regular Rate:Normal ? ? ?  ?Neuro/Psych ?negative neurological ROS ?   ? GI/Hepatic ?Neg liver ROS, GERD  ,  ?Endo/Other  ?negative endocrine ROS ? Renal/GU ?negative Renal ROS  ? ?  ?Musculoskeletal ? ?(+) Arthritis ,  ? Abdominal ?  ?Peds ? Hematology ?negative hematology ROS ?(+)   ?Anesthesia Other Findings ? ? Reproductive/Obstetrics ? ?  ? ? ? ? ? ? ? ? ? ? ? ? ? ?  ?  ? ? ? ? ? ? ? ?Anesthesia Physical ?Anesthesia Plan ? ?ASA: 3 ? ?Anesthesia Plan: General  ? ?Post-op Pain Management:   ? ?Induction: Intravenous ? ?PONV Risk Score and Plan: 2 and Ondansetron, Dexamethasone and Midazolam ? ?Airway Management Planned: Oral ETT ? ?Additional Equipment:  ? ?Intra-op Plan:  ? ?Post-operative Plan: Extubation in OR ? ?Informed Consent: I have reviewed the patients History and Physical, chart, labs and discussed the procedure including the risks, benefits and alternatives for the proposed anesthesia with the patient or authorized representative who has indicated his/her understanding and acceptance.  ? ? ? ?Dental advisory given ? ?Plan Discussed with: CRNA and Anesthesiologist ? ?Anesthesia Plan Comments:   ? ? ? ? ? ? ?Anesthesia Quick Evaluation ? ?

## 2022-04-26 NOTE — Progress Notes (Signed)
Up and walked and tolerated well; dime size of old drainage left groin, dressing changed and no further bleeding noted ?

## 2022-04-29 ENCOUNTER — Encounter (HOSPITAL_COMMUNITY): Payer: Self-pay | Admitting: Cardiology

## 2022-04-29 MED FILL — Heparin Sod (Porcine)-NaCl IV Soln 1000 Unit/500ML-0.9%: INTRAVENOUS | Qty: 2000 | Status: AC

## 2022-05-01 ENCOUNTER — Telehealth: Payer: Self-pay | Admitting: Cardiovascular Disease

## 2022-05-01 NOTE — Telephone Encounter (Signed)
[employer] calling to get last office visit notes from patient faxed to them.  ? ?[phone] - fax ? ?[phone] - phone ?

## 2022-05-24 ENCOUNTER — Ambulatory Visit (HOSPITAL_COMMUNITY)
Admission: RE | Admit: 2022-05-24 | Discharge: 2022-05-24 | Disposition: A | Discharge status: Home / Self Care | Payer: BC Managed Care – PPO | Source: Ambulatory Visit | Attending: Physician Assistant | Admitting: Physician Assistant

## 2022-05-24 VITALS — BP 120/76 | HR 55 | Ht 78.0 in | Wt 200.8 lb

## 2022-05-24 DIAGNOSIS — Z7901 Long term (current) use of anticoagulants: Secondary | ICD-10-CM | POA: Insufficient documentation

## 2022-05-24 DIAGNOSIS — K219 Gastro-esophageal reflux disease without esophagitis: Secondary | ICD-10-CM | POA: Diagnosis not present

## 2022-05-24 DIAGNOSIS — I351 Nonrheumatic aortic (valve) insufficiency: Secondary | ICD-10-CM | POA: Diagnosis not present

## 2022-05-24 DIAGNOSIS — I358 Other nonrheumatic aortic valve disorders: Secondary | ICD-10-CM | POA: Insufficient documentation

## 2022-05-24 DIAGNOSIS — I480 Paroxysmal atrial fibrillation: Secondary | ICD-10-CM | POA: Insufficient documentation

## 2022-05-24 DIAGNOSIS — Z79899 Other long term (current) drug therapy: Secondary | ICD-10-CM | POA: Insufficient documentation

## 2022-05-24 NOTE — Progress Notes (Signed)
Primary Care Physician: Sueanne Margarita, DO Primary Cardiologist: Dr Angelena Form Primary Electrophysiologist: Dr Quentin Ore Referring Physician: Dr Priscella Mann Joseph Brennan is a 61 y.o. male with a history of aortic insufficieny, aortic atherosclerosis, and atrial fibrillation who presents for follow up in the [facility].  The patient was initially diagnosed with atrial fibrillation 10/2021 on his smart watch. He does have symptoms of fatigue, lightheadedness and dizziness when in afib. Patient is on Eliquis for a CHADS2VASC score of 1. He was seen by Dr Quentin Ore on 02/11/22 for increasing frequency of afib and underwent afib and flutter ablation on 04/26/22.   On follow up today, patient reports that overall he has done well since ablation. He has had some intermittent dizziness and fatigue since the procedure but no afib noted on his Apple Watch. No CP, swallowing pain, or groin issues.   Today, he denies symptoms of palpitations, chest pain, shortness of breath, orthopnea, PND, lower extremity edema, presyncope, syncope, snoring, daytime somnolence, bleeding, or neurologic sequela. The patient is tolerating medications without difficulties and is otherwise without complaint today.    Atrial Fibrillation Risk Factors:  he does not have symptoms or diagnosis of sleep apnea. he does not have a history of rheumatic fever.   he has a BMI of Body mass index is 23.2 kg/m.Marland Kitchen Filed Weights   05/24/22 1054  Weight: 91.1 kg    Family History  Problem Relation Age of Onset   Breast cancer Mother    Heart disease Father    Diabetes Father    Prostate cancer Father    Skin cancer Father    Healthy Sister    Healthy Brother    Heart disease Maternal Grandfather    Heart disease Paternal Grandfather    Healthy Brother    Colon cancer Neg Hx    Pancreatic cancer Neg Hx    Liver disease Neg Hx    Esophageal cancer Neg Hx    Stomach cancer Neg Hx    Colon polyps Neg  Hx    Rectal cancer Neg Hx      Atrial Fibrillation Management history:  Previous antiarrhythmic drugs: none Previous cardioversions: none Previous ablations: 04/26/22 CHADS2VASC score: 1 Anticoagulation history: Eliquis   Past Medical History:  Diagnosis Date   A-fib [facility])    Anxiety    Arthritis    Chest pain    Diverticulitis    August 2021   Diverticulosis    Fatigue    GERD (gastroesophageal reflux disease)    Hand arthritis    Hematuria    Insomnia    Lightheadedness    Past Surgical History:  Procedure Laterality Date   ADENOIDECTOMY     ATRIAL FIBRILLATION ABLATION N/A 04/26/2022   Procedure: ATRIAL FIBRILLATION ABLATION;  Surgeon: Vickie Epley, MD;  Location: [place] [facility];  Service: Cardiovascular;  Laterality: N/A;   ESOPHAGEAL DILATION     right knee arthroscopy     Dr. Onnie Graham for mensicus injury   UPPER GASTROINTESTINAL ENDOSCOPY  2011    Current Outpatient Medications  Medication Sig Dispense Refill   apixaban (ELIQUIS) 5 MG TABS tablet Take 1 tablet (5 mg total) by mouth 2 (two) times daily. 60 tablet 5   b complex vitamins capsule Take 1 capsule by mouth daily.     Calcium-Vitamin D-Vitamin K (CALCIUM SOFT CHEWS PO) Take 650 mg by mouth every other day.     Cholecalciferol (VITAMIN D) 125 MCG (5000 UT) CAPS  Take 5,000 Units by mouth daily.     MAGNESIUM PO Take 500 mg by mouth daily.     Menaquinone-7 (VITAMIN K2) 100 MCG CAPS Take 100 mcg by mouth daily.     metoprolol tartrate (LOPRESSOR) 25 MG tablet Take 25 mg by mouth daily as needed (AFIB or High Heart Rate).     Misc Natural Products (GLUCOSAMINE CHOND MSM FORMULA PO) Take 1 tablet by mouth daily.     Multiple Vitamins-Minerals (MULTIVITAMIN WITH MINERALS) tablet Take 1 tablet by mouth daily.     Omega-3 Fatty Acids (FISH OIL) 1200 MG CAPS Take 1,200 mg by mouth daily.     omeprazole (PRILOSEC OTC) 20 MG tablet Take 20 mg by mouth daily.     pantoprazole (PROTONIX) 40 MG tablet  Take 1 tablet (40 mg total) by mouth daily. 45 tablet 0   Probiotic Product (PROBIOTIC DAILY PO) Take 1 capsule by mouth daily.     Psyllium (METAMUCIL FIBER PO) Take 2 Scoops by mouth daily.     Taurine 1000 MG CAPS Take 1,000 mg by mouth daily.     vitamin C (ASCORBIC ACID) 500 MG tablet Take 500 mg by mouth daily.     No current facility-administered medications for this encounter.    Not on File  Social History   Socioeconomic History   Marital status: Married    Spouse name: Not on file   Number of children: 2   Years of education: Not on file   Highest education level: Not on file  Occupational History   Occupation: Music therapist  Tobacco Use   Smoking status: Never   Smokeless tobacco: Never  Vaping Use   Vaping Use: Never used  Substance and Sexual Activity   Alcohol use: Not Currently   Drug use: No   Sexual activity: Yes    Partners: Female    Birth control/protection: None    Comment: married  Other Topics Concern   Not on file  Social History Narrative   Not on file   Social Determinants of Health   Financial Resource Strain: Not on file  Food Insecurity: Not on file  Transportation Needs: Not on file  Physical Activity: Not on file  Stress: Not on file  Social Connections: Not on file  Intimate Partner Violence: Not on file     ROS- All systems are reviewed and negative except as per the HPI above.  Physical Exam: Vitals:   05/24/22 1054  BP: 120/76  Pulse: (!) 55  Weight: 91.1 kg  Height: '6\' 6"'$  (1.981 m)    GEN- The patient is a well appearing male, alert and oriented x 3 today.   Head- normocephalic, atraumatic Eyes-  Sclera clear, conjunctiva pink Ears- hearing intact Oropharynx- clear Neck- supple  Lungs- Clear to ausculation bilaterally, normal work of breathing Heart- Regular rate and rhythm, no murmurs, rubs or gallops  GI- soft, NT, ND, + BS Extremities- no clubbing, cyanosis, or edema MS- no significant deformity or  atrophy Skin- no rash or lesion Psych- euthymic mood, full affect Neuro- strength and sensation are intact  Wt Readings from Last 3 Encounters:  05/24/22 91.1 kg  04/26/22 89.4 kg  02/11/22 89.3 kg    EKG today demonstrates  SB Vent. rate 55 BPM PR interval 156 ms QRS duration 98 ms QT/QTcB 410/392 ms  Echo 04/19/22 demonstrated  1. Left ventricular ejection fraction, by estimation, is 65 to 70%. The  left ventricle has normal function. The left ventricle  has no regional  wall motion abnormalities. Left ventricular diastolic parameters were  normal. Notable trabeculation without evidence of LVNC.   2. Right ventricular systolic function is normal. The right ventricular  size is normal. Tricuspid regurgitation signal is inadequate for assessing PA pressure.   3. Left atrial size was moderately dilated.   4. The mitral valve is abnormal- billowing without frank prolapse.  Trivial mitral valve regurgitation. No evidence of mitral stenosis.   5. The aortic valve is tricuspid. There is mild calcification of the  aortic valve, with thickening increase for age. There is mild thickening of the aortic valve. Aortic valve regurgitation is mild to moderate at most. Pressure Half time 1047 ms. VC 3.3 mm. Aortic valve sclerosis is present, with no evidence of aortic valve stenosis.   6. Aortic dilatation noted. There is mild dilatation of the ascending  aorta, measuring 40 mm.   Epic records are reviewed at length today  CHA2DS2-VASc Score = 1  The patient's score is based upon: CHF History: 0 HTN History: 0 Diabetes History: 0 Stroke History: 0 Vascular Disease History: 1 (aortic atherosclerosis) Age Score: 0 Gender Score: 0       ASSESSMENT AND PLAN: 1. Paroxysmal Atrial Fibrillation (ICD10:  I48.0) The patient's CHA2DS2-VASc score is 1, indicating a 0.6% annual risk of stroke.   S/p afib and flutter ablation 04/26/22 Patient appears to be maintaining SR. Continue Eliquis 5 mg  BID with no missed doses for 3 months post ablation.  Continue metoprolol 25 mg daily PRN for heart racing. Apple Watch for home monitoring.   2. Aortic insufficiency  Moderate Followed by Dr Angelena Form   Follow up with Dr Quentin Ore as scheduled.    [facility] [address] [place], [place] [address] [phone] 05/24/2022 11:23 AM

## 2022-07-31 ENCOUNTER — Ambulatory Visit (INDEPENDENT_AMBULATORY_CARE_PROVIDER_SITE_OTHER): Payer: BC Managed Care – PPO | Admitting: Cardiology

## 2022-07-31 ENCOUNTER — Encounter: Payer: Self-pay | Admitting: Cardiology

## 2022-07-31 VITALS — BP 112/70 | HR 59 | Ht 78.0 in | Wt 195.2 lb

## 2022-07-31 DIAGNOSIS — I480 Paroxysmal atrial fibrillation: Secondary | ICD-10-CM | POA: Diagnosis not present

## 2022-07-31 DIAGNOSIS — I351 Nonrheumatic aortic (valve) insufficiency: Secondary | ICD-10-CM

## 2022-07-31 NOTE — Progress Notes (Signed)
Electrophysiology Office Follow up Visit Note:    Date:  07/31/2022   ID:  Joseph Brennan, DOB 11/09/1961, MRN [id number]  PCP:  Sueanne Margarita, DO  [place] Cardiologist:  None  [facility] Electrophysiologist:  Vickie Epley, MD    Interval History:    Joseph Brennan is a 61 y.o. male who presents for a follow up visit after his A-fib ablation Apr 26, 2022.  During the ablation, the veins and CTI were ablated.  He saw Audry Pili in the A-fib clinic on May 24, 2022.  At that appointment he was maintaining sinus rhythm.  He takes Eliquis for stroke prophylaxis and uses an Visual merchandiser for home monitoring of his rhythm.  He feels his heart rhythm has significantly improved since his ablation. He still does have some amount of afib, but only smaller afib episodes.   He recorded an episode at home, which demonstrated Sinus Rhythm with PACs.  He is experiencing no symptoms in the groin area.  He has fully returned to his work and active lifestyle.   His heart rate when sleeping is consistently in the low forties. Sometimes when he first wakes up, his heart rate will only ramp up to the high forties.  The patient wishes to stop Eliquis.      Past Medical History:  Diagnosis Date   A-fib [facility])    Anxiety    Arthritis    Chest pain    Diverticulitis    August 2021   Diverticulosis    Fatigue    GERD (gastroesophageal reflux disease)    Hand arthritis    Hematuria    Insomnia    Lightheadedness     Past Surgical History:  Procedure Laterality Date   ADENOIDECTOMY     ATRIAL FIBRILLATION ABLATION N/A 04/26/2022   Procedure: ATRIAL FIBRILLATION ABLATION;  Surgeon: Vickie Epley, MD;  Location: [place] [facility];  Service: Cardiovascular;  Laterality: N/A;   ESOPHAGEAL DILATION     right knee arthroscopy     Dr. Onnie Graham for mensicus injury   UPPER GASTROINTESTINAL ENDOSCOPY  2011    Current Medications: Current Meds  Medication Sig   apixaban (ELIQUIS) 5 MG  TABS tablet Take 1 tablet (5 mg total) by mouth 2 (two) times daily.   b complex vitamins capsule Take 1 capsule by mouth daily.   Calcium-Vitamin D-Vitamin K (CALCIUM SOFT CHEWS PO) Take 650 mg by mouth every other day.   Cholecalciferol (VITAMIN D) 125 MCG (5000 UT) CAPS Take 5,000 Units by mouth daily.   CREATINE PO Take by mouth.   MAGNESIUM PO Take 500 mg by mouth daily.   Menaquinone-7 (VITAMIN K2) 100 MCG CAPS Take 100 mcg by mouth daily.   metoprolol tartrate (LOPRESSOR) 25 MG tablet Take 25 mg by mouth daily as needed (AFIB or High Heart Rate).   Misc Natural Products (GLUCOSAMINE CHOND MSM FORMULA PO) Take 1 tablet by mouth daily.   Multiple Vitamins-Minerals (MULTIVITAMIN WITH MINERALS) tablet Take 1 tablet by mouth daily.   Omega-3 Fatty Acids (FISH OIL) 1200 MG CAPS Take 1,200 mg by mouth daily.   omeprazole (PRILOSEC OTC) 20 MG tablet Take 20 mg by mouth daily.   Probiotic Product (PROBIOTIC DAILY PO) Take 1 capsule by mouth daily.   Psyllium (METAMUCIL FIBER PO) Take 2 Scoops by mouth daily.   Taurine 1000 MG CAPS Take 1,000 mg by mouth daily.   vitamin C (ASCORBIC ACID) 500 MG tablet Take 500 mg by mouth daily.  Allergies:   Patient has no allergy information on record.   Social History   Socioeconomic History   Marital status: Married    Spouse name: Not on file   Number of children: 2   Years of education: Not on file   Highest education level: Not on file  Occupational History   Occupation: Music therapist  Tobacco Use   Smoking status: Never   Smokeless tobacco: Never  Vaping Use   Vaping Use: Never used  Substance and Sexual Activity   Alcohol use: Not Currently   Drug use: No   Sexual activity: Yes    Partners: Female    Birth control/protection: None    Comment: married  Other Topics Concern   Not on file  Social History Narrative   Not on file   Social Determinants of Health   Financial Resource Strain: Not on file  Food Insecurity: Not  on file  Transportation Needs: Not on file  Physical Activity: Not on file  Stress: Not on file  Social Connections: Not on file     Family History: The patient's family history includes Breast cancer in his mother; Diabetes in his father; Healthy in his brother, brother, and sister; Heart disease in his father, maternal grandfather, and paternal grandfather; Prostate cancer in his father; Skin cancer in his father. There is no history of Colon cancer, Pancreatic cancer, Liver disease, Esophageal cancer, Stomach cancer, Colon polyps, or Rectal cancer.  ROS:   Please see the history of present illness.   (+) Afib    All other systems reviewed and are negative.  EKGs/Labs/Other Studies Reviewed:    The following studies were reviewed today:  Atrial Fibrillation Ablation 04/26/22:  CONCLUSIONS: 1. Successful PVI 2. Successful ablation of the cavotricuspid isthmus for typical atrial flutter 3. Intracardiac echo reveals trivial pericardial effusion, normal left atrial architecture 4. No early apparent complications. 5. Colchicine 0.'6mg'$  PO BID x 5 days 6. Protonix '40mg'$  PO daily x 45 days  CT Cardiac Morph 04/19/22:  IMPRESSION: 1. There is normal pulmonary vein drainage into the left atrium with ostial measurements above.   2. There is no thrombus in the left atrial appendage.   3. The esophagus runs in the left atrial midline and is not in proximity to any of the pulmonary vein ostia.   4. Small PFO.   5. Normal coronary origin. Right dominance.   6. CAC score of 28 which is 55 percentile for age-, race-, and sex-matched controls.  Echocardiogram 04/19/22:  IMPRESSIONS     1. Left ventricular ejection fraction, by estimation, is 65 to 70%. The  left ventricle has normal function. The left ventricle has no regional  wall motion abnormalities. Left ventricular diastolic parameters were  normal. Notable trabeculation without  evidence of LVNC.   2. Right ventricular  systolic function is normal. The right ventricular  size is normal. Tricuspid regurgitation signal is inadequate for assessing  PA pressure.   3. Left atrial size was moderately dilated.   4. The mitral valve is abnormal- billowing without frank prolapse.  Trivial mitral valve regurgitation. No evidence of mitral stenosis.   5. The aortic valve is tricuspid. There is mild calcification of the  aortic valve, with thickening increase for age. There is mild thickening  of the aortic valve. Aortic valve regurgitation is mild to moderate at  most. Pressure Half time 1047 ms. VC 3.3   mm. Aortic valve sclerosis is present, with no evidence of aortic valve  stenosis.  6. Aortic dilatation noted. There is mild dilatation of the ascending  aorta, measuring 40 mm.   Comparison(s): Similar to 2022 Echo Report.   Long Term Monitor 01/16/22:  Patch Wear Time:  14 days and 0 hours (2023-01-12T11:44:54-0500 to 2023-01-26T11:44:58-0500)   Sinus rhythm. Minimum HR of 36 bpm, max HR of 250 bpm, and avg HR of 67 bpm.  57 Supraventricular Tachycardia runs occurred, the run with the fastest interval lasting 13.0 secs with a max rate of 250 bpm (avg  160 bpm); the run with the fastest interval was also the longest.  Atrial Flutter occurred (<1% burden), ranging from 67-142 bpm (avg of 106 bpm), the longest lasting 1 min 7 secs with an avg rate of 123 bpm Rare premature atrial contractions  PVC Echocardiogram 10/26/2021:  Echocardiogram 10/26/2021:  Normal LV systolic function with visual EF 60-65%. Left ventricle cavity  is normal in size. Normal left ventricular wall thickness. Normal global  wall motion. Normal diastolic filling pattern, normal LAP.  Moderate (Grade II) aortic regurgitation.  Mild (Grade I) mitral regurgitation.  No prior study for comparison   EKG:  The ekg ordered today demonstrates NSR.  Recent Labs: 04/02/2022: Hemoglobin 15.7; Platelets 154 04/09/2022: BUN 18; Creatinine,  Ser 0.97; Potassium 4.9; Sodium 139  Recent Lipid Panel No results found for: "CHOL", "TRIG", "HDL", "CHOLHDL", "VLDL", "LDLCALC", "LDLDIRECT"  Physical Exam:    VS:  BP 112/70   Pulse (!) 59   Ht '6\' 6"'$  (1.981 m)   Wt 195 lb 3.2 oz (88.5 kg)   SpO2 96%   BMI 22.56 kg/m     Wt Readings from Last 3 Encounters:  07/31/22 195 lb 3.2 oz (88.5 kg)  05/24/22 200 lb 12.8 oz (91.1 kg)  04/26/22 197 lb (89.4 kg)     GEN:  Well nourished, well developed in no acute distress HEENT: Normal NECK: No JVD; No carotid bruits LYMPHATICS: No lymphadenopathy CARDIAC: RRR, no murmurs, rubs, gallops RESPIRATORY:  Clear to auscultation without rales, wheezing or rhonchi  ABDOMEN: Soft, non-tender, non-distended MUSCULOSKELETAL:  No edema; No deformity  SKIN: Warm and dry NEUROLOGIC:  Alert and oriented x 3 PSYCHIATRIC:  Normal affect        ASSESSMENT:    1. Paroxysmal atrial fibrillation (HCC)   2. Nonrheumatic aortic valve insufficiency    PLAN:    In order of problems listed above:  #Paroxysmal atrial fibrillation CHA2DS2-VASc of 1.  Post successful A-fib ablation Apr 26, 2022. He has maintained normal rhythm without recurrence of his arrhythmia.  We have a long discussion today about his risks of stroke on and off anticoagulation after an atrial fibrillation ablation.  I discussed how he would always be at a slight increased risk of stroke compared to a patient who had never had a history of atrial fibrillation.  We discussed his CHA2DS2-VASc score of 1.  I would like to be a little bit more conservative and keep him on blood thinners for at least 6 months after his ablation.  If he has had no recurrence of arrhythmia for 6 months after the ablation, I think it is reasonable for him to stop the anticoagulant and start a baby aspirin once daily.  He would like to proceed with this plan.  I have asked him to utilize a form of heart rhythm monitoring moving forward at least 2-3 times per  week.  We discussed using the [employer], Apple watch and Galaxy watch.  #Aortic insufficiency Follows with Dr. Angelena Form  Follow-up 1 year or sooner as needed.  Medication Adjustments/Labs and Tests Ordered: Current medicines are reviewed at length with the patient today.  Concerns regarding medicines are outlined above.  Orders Placed This Encounter  Procedures   EKG 12-Lead   No orders of the defined types were placed in this encounter.   I,Mary Mosetta Pigeon Buren,acting as a scribe for Vickie Epley, MD.,have documented all relevant documentation on the behalf of Vickie Epley, MD,as directed by  Vickie Epley, MD while in the presence of Vickie Epley, MD.    I, Vickie Epley, MD, have reviewed all documentation for this visit. The documentation on 07/31/22 for the exam, diagnosis, procedures, and orders are all accurate and complete.   Signed, Lars Mage, MD, [facility], [facility] 07/31/2022 7:40 PM    Electrophysiology [facility]

## 2022-07-31 NOTE — Patient Instructions (Signed)
Medication Instructions:  Stop your Eliquis on November 12  Start Aspirin 81 mg on November 13 *If you need a refill on your cardiac medications before your next appointment, please call your pharmacy*  Lab Work: none If you have labs (blood work) drawn today and your tests are completely normal, you will receive your results only by: [place] (if you have MyChart) OR A paper copy in the mail If you have any lab test that is abnormal or we need to change your treatment, we will call you to review the results.   Testing/Procedures: none   Follow-Up: At [facility], you and your health needs are our priority.  As part of our continuing mission to provide you with exceptional heart care, we have created designated Provider Care Teams.  These Care Teams include your primary Cardiologist (physician) and Advanced Practice Providers (APPs -  Physician Assistants and Nurse Practitioners) who all work together to provide you with the care you need, when you need it.  We recommend signing up for the patient portal called "MyChart".  Sign up information is provided on this After Visit Summary.  MyChart is used to connect with patients for Virtual Visits (Telemedicine).  Patients are able to view lab/test results, encounter notes, upcoming appointments, etc.  Non-urgent messages can be sent to your provider as well.   To learn more about what you can do with MyChart, go to [email or web address].    Your next appointment:   1 year(s)  The format for your next appointment:   In Person  Provider:   Lars Mage, MD    Other Instructions none  Important Information About Sugar

## 2022-07-31 NOTE — Progress Notes (Deleted)
Electrophysiology Office Follow up Visit Note:    Date:  07/31/2022   ID:  Joseph Brennan, DOB Oct 24, 1961, MRN [id number]  PCP:  Sueanne Margarita, DO  [place] Cardiologist:  None  [facility] Electrophysiologist:  Vickie Epley, MD    Interval History:    Joseph Brennan is a 61 y.o. male who presents for a follow up visit after his A-fib ablation Apr 26, 2022.  During the ablation, the veins and CTI were ablated.  He saw Joseph Brennan in the A-fib clinic on May 24, 2022.  At that appointment he was maintaining sinus rhythm.  He takes Eliquis for stroke prophylaxis and uses an Visual merchandiser for home monitoring of his rhythm.       Past Medical History:  Diagnosis Date   A-fib [facility])    Anxiety    Arthritis    Chest pain    Diverticulitis    August 2021   Diverticulosis    Fatigue    GERD (gastroesophageal reflux disease)    Hand arthritis    Hematuria    Insomnia    Lightheadedness     Past Surgical History:  Procedure Laterality Date   ADENOIDECTOMY     ATRIAL FIBRILLATION ABLATION N/A 04/26/2022   Procedure: ATRIAL FIBRILLATION ABLATION;  Surgeon: Vickie Epley, MD;  Location: [place] [facility];  Service: Cardiovascular;  Laterality: N/A;   ESOPHAGEAL DILATION     right knee arthroscopy     Dr. Onnie Graham for mensicus injury   UPPER GASTROINTESTINAL ENDOSCOPY  2011    Current Medications: No outpatient medications have been marked as taking for the 07/31/22 encounter (Appointment) with Vickie Epley, MD.     Allergies:   Patient has no allergy information on record.   Social History   Socioeconomic History   Marital status: Married    Spouse name: Not on file   Number of children: 2   Years of education: Not on file   Highest education level: Not on file  Occupational History   Occupation: Music therapist  Tobacco Use   Smoking status: Never   Smokeless tobacco: Never  Vaping Use   Vaping Use: Never used  Substance and Sexual Activity    Alcohol use: Not Currently   Drug use: No   Sexual activity: Yes    Partners: Female    Birth control/protection: None    Comment: married  Other Topics Concern   Not on file  Social History Narrative   Not on file   Social Determinants of Health   Financial Resource Strain: Not on file  Food Insecurity: Not on file  Transportation Needs: Not on file  Physical Activity: Not on file  Stress: Not on file  Social Connections: Not on file     Family History: The patient's family history includes Breast cancer in his mother; Diabetes in his father; Healthy in his brother, brother, and sister; Heart disease in his father, maternal grandfather, and paternal grandfather; Prostate cancer in his father; Skin cancer in his father. There is no history of Colon cancer, Pancreatic cancer, Liver disease, Esophageal cancer, Stomach cancer, Colon polyps, or Rectal cancer.  ROS:   Please see the history of present illness.    All other systems reviewed and are negative.  EKGs/Labs/Other Studies Reviewed:    The following studies were reviewed today:   EKG:  The ekg ordered today demonstrates ***  Recent Labs: 04/02/2022: Hemoglobin 15.7; Platelets 154 04/09/2022: BUN 18; Creatinine, Ser 0.97; Potassium 4.9;  Sodium 139  Recent Lipid Panel No results found for: "CHOL", "TRIG", "HDL", "CHOLHDL", "VLDL", "LDLCALC", "LDLDIRECT"  Physical Exam:    VS:  There were no vitals taken for this visit.    Wt Readings from Last 3 Encounters:  05/24/22 200 lb 12.8 oz (91.1 kg)  04/26/22 197 lb (89.4 kg)  02/11/22 196 lb 12.8 oz (89.3 kg)     GEN: *** Well nourished, well developed in no acute distress HEENT: Normal NECK: No JVD; No carotid bruits LYMPHATICS: No lymphadenopathy CARDIAC: ***RRR, no murmurs, rubs, gallops RESPIRATORY:  Clear to auscultation without rales, wheezing or rhonchi  ABDOMEN: Soft, non-tender, non-distended MUSCULOSKELETAL:  No edema; No deformity  SKIN: Warm and  dry NEUROLOGIC:  Alert and oriented x 3 PSYCHIATRIC:  Normal affect        ASSESSMENT:    1. Paroxysmal atrial fibrillation (HCC)   2. Nonrheumatic aortic valve insufficiency    PLAN:    In order of problems listed above:  #Paroxysmal atrial fibrillation CHA2DS2-VASc of 1.  Post successful A-fib ablation Apr 26, 2022.  Off Eliquis and changed to aspirin at the 59-monthmark?  #Aortic insufficiency Follows with Dr. MAngelena Form        Total time spent with patient today *** minutes. This includes reviewing records, evaluating the patient and coordinating care.   Medication Adjustments/Labs and Tests Ordered: Current medicines are reviewed at length with the patient today.  Concerns regarding medicines are outlined above.  No orders of the defined types were placed in this encounter.  No orders of the defined types were placed in this encounter.    Signed, CLars Mage MD, F[facility] FSystem [employer] Inc8/16/2023 6:10 AM    Electrophysiology [facility]

## 2022-10-09 DIAGNOSIS — R7989 Other specified abnormal findings of blood chemistry: Secondary | ICD-10-CM | POA: Diagnosis not present

## 2022-10-09 DIAGNOSIS — Z125 Encounter for screening for malignant neoplasm of prostate: Secondary | ICD-10-CM | POA: Diagnosis not present

## 2022-10-16 ENCOUNTER — Other Ambulatory Visit: Payer: Self-pay | Admitting: Cardiology

## 2022-10-16 DIAGNOSIS — R3129 Other microscopic hematuria: Secondary | ICD-10-CM | POA: Diagnosis not present

## 2022-10-16 DIAGNOSIS — I480 Paroxysmal atrial fibrillation: Secondary | ICD-10-CM

## 2022-10-16 DIAGNOSIS — D6869 Other thrombophilia: Secondary | ICD-10-CM | POA: Diagnosis not present

## 2022-10-16 DIAGNOSIS — Z0000 Encounter for general adult medical examination without abnormal findings: Secondary | ICD-10-CM | POA: Diagnosis not present

## 2022-10-16 DIAGNOSIS — Z1331 Encounter for screening for depression: Secondary | ICD-10-CM | POA: Diagnosis not present

## 2022-10-17 NOTE — Telephone Encounter (Signed)
Eliquis '5mg'$  refill request received. Patient is 61 years old, weight-88.5kg, Crea-0.97 on 04/09/2022, Diagnosis-Afib, and last seen by Dr. Quentin Ore on 07/31/2022. Dose is appropriate based on dosing criteria. Will send in refill to requested pharmacy.   Per last OV note with Dr. Quentin Ore: Stop your Eliquis on November 12  Start Aspirin 81 mg on November 13

## 2022-10-28 ENCOUNTER — Encounter: Payer: Self-pay | Admitting: *Deleted

## 2022-10-28 MED ORDER — ASPIRIN 81 MG PO TBEC: 81.0000 mg | DELAYED_RELEASE_TABLET | Freq: Every day | ORAL | 3 refills | Status: AC

## 2023-03-24 ENCOUNTER — Encounter (HOSPITAL_COMMUNITY): Payer: Self-pay | Admitting: Cardiovascular Disease

## 2023-03-25 DIAGNOSIS — H903 Sensorineural hearing loss, bilateral: Secondary | ICD-10-CM | POA: Diagnosis not present

## 2023-03-25 DIAGNOSIS — H6121 Impacted cerumen, right ear: Secondary | ICD-10-CM | POA: Diagnosis not present

## 2023-04-01 ENCOUNTER — Ambulatory Visit (HOSPITAL_COMMUNITY): Payer: BC Managed Care – PPO | Attending: Internal Medicine

## 2023-04-01 DIAGNOSIS — I351 Nonrheumatic aortic (valve) insufficiency: Secondary | ICD-10-CM | POA: Diagnosis not present

## 2023-04-01 LAB — ECHOCARDIOGRAM COMPLETE
Area-P 1/2: 2.65 cm2
P 1/2 time: 669 msec
S' Lateral: 3.6 cm

## 2023-04-29 DIAGNOSIS — L814 Other melanin hyperpigmentation: Secondary | ICD-10-CM | POA: Diagnosis not present

## 2023-04-29 DIAGNOSIS — L821 Other seborrheic keratosis: Secondary | ICD-10-CM | POA: Diagnosis not present

## 2023-04-29 DIAGNOSIS — L218 Other seborrheic dermatitis: Secondary | ICD-10-CM | POA: Diagnosis not present

## 2023-04-29 DIAGNOSIS — D225 Melanocytic nevi of trunk: Secondary | ICD-10-CM | POA: Diagnosis not present

## 2023-06-13 ENCOUNTER — Encounter (HOSPITAL_BASED_OUTPATIENT_CLINIC_OR_DEPARTMENT_OTHER): Payer: Self-pay | Admitting: Orthopaedic Surgery

## 2023-06-13 ENCOUNTER — Ambulatory Visit (INDEPENDENT_AMBULATORY_CARE_PROVIDER_SITE_OTHER): Payer: BC Managed Care – PPO

## 2023-06-13 ENCOUNTER — Other Ambulatory Visit (HOSPITAL_BASED_OUTPATIENT_CLINIC_OR_DEPARTMENT_OTHER): Payer: Self-pay | Admitting: Orthopaedic Surgery

## 2023-06-13 ENCOUNTER — Other Ambulatory Visit (HOSPITAL_BASED_OUTPATIENT_CLINIC_OR_DEPARTMENT_OTHER): Payer: Self-pay

## 2023-06-13 ENCOUNTER — Ambulatory Visit (INDEPENDENT_AMBULATORY_CARE_PROVIDER_SITE_OTHER): Payer: BC Managed Care – PPO | Admitting: Orthopaedic Surgery

## 2023-06-13 DIAGNOSIS — M1811 Unilateral primary osteoarthritis of first carpometacarpal joint, right hand: Secondary | ICD-10-CM | POA: Diagnosis not present

## 2023-06-13 DIAGNOSIS — M25521 Pain in right elbow: Secondary | ICD-10-CM

## 2023-06-13 DIAGNOSIS — M25522 Pain in left elbow: Secondary | ICD-10-CM | POA: Diagnosis not present

## 2023-06-13 DIAGNOSIS — M79641 Pain in right hand: Secondary | ICD-10-CM | POA: Diagnosis not present

## 2023-06-13 DIAGNOSIS — G8929 Other chronic pain: Secondary | ICD-10-CM | POA: Diagnosis not present

## 2023-06-13 MED ORDER — MELOXICAM 15 MG PO TABS
15.0000 mg | ORAL_TABLET | Freq: Every day | ORAL | 0 refills | Status: DC
  Filled 2023-06-13: qty 14, 14d supply, fill #0

## 2023-06-13 MED ORDER — TRIAMCINOLONE ACETONIDE 40 MG/ML IJ SUSP
80.0000 mg | INTRAMUSCULAR | Status: AC | PRN
  Administered 2023-06-13: 80 mg via INTRA_ARTICULAR

## 2023-06-13 MED ORDER — LIDOCAINE HCL 1 % IJ SOLN
4.0000 mL | INTRAMUSCULAR | Status: AC | PRN
  Administered 2023-06-13: 4 mL

## 2023-06-13 NOTE — Progress Notes (Signed)
Chief Complaint: Bilateral leg pain, right     History of Present Illness:    Joseph Brennan is a 62 y.o. male left -hand-dominant male presents with bilateral elbow pain.  He states that the right elbow has been painful for a longer period of time.  The left elbow has been painful more recently as he has been reading up his weekly routine.  The right thumb has also been persistently sore and tender about the right base of the thumb.  He has not had any previous treatment for these.  He is very active and enjoys going to the gym.    Surgical History:   None  PMH/PSH/Family History/Social History/Meds/Allergies:    Past Medical History:  Diagnosis Date   A-fib [facility])    Anxiety    Arthritis    Chest pain    Diverticulitis    August 2021   Diverticulosis    Fatigue    GERD (gastroesophageal reflux disease)    Hand arthritis    Hematuria    Insomnia    Lightheadedness    Past Surgical History:  Procedure Laterality Date   ADENOIDECTOMY     ATRIAL FIBRILLATION ABLATION N/A 04/26/2022   Procedure: ATRIAL FIBRILLATION ABLATION;  Surgeon: Lanier Prude, MD;  Location: [facility];  Service: Cardiovascular;  Laterality: N/A;   ESOPHAGEAL DILATION     right knee arthroscopy     Dr. Rennis Chris for mensicus injury   UPPER GASTROINTESTINAL ENDOSCOPY  2011   Social History   Socioeconomic History   Marital status: Married    Spouse name: Not on file   Number of children: 2   Years of education: Not on file   Highest education level: Not on file  Occupational History   Occupation: Firefighter  Tobacco Use   Smoking status: Never   Smokeless tobacco: Never  Vaping Use   Vaping Use: Never used  Substance and Sexual Activity   Alcohol use: Not Currently   Drug use: No   Sexual activity: Yes    Partners: Female    Birth control/protection: None    Comment: married  Other Topics Concern   Not on file  Social History  Narrative   Not on file   Social Determinants of Health   Financial Resource Strain: Not on file  Food Insecurity: Not on file  Transportation Needs: Not on file  Physical Activity: Not on file  Stress: Not on file  Social Connections: Not on file   Family History  Problem Relation Age of Onset   Breast cancer Mother    Heart disease Father    Diabetes Father    Prostate cancer Father    Skin cancer Father    Healthy Sister    Healthy Brother    Heart disease Maternal Grandfather    Heart disease Paternal Grandfather    Healthy Brother    Colon cancer Neg Hx    Pancreatic cancer Neg Hx    Liver disease Neg Hx    Esophageal cancer Neg Hx    Stomach cancer Neg Hx    Colon polyps Neg Hx    Rectal cancer Neg Hx    Not on File Current Outpatient Medications  Medication Sig Dispense Refill   meloxicam (MOBIC) 15 MG tablet Take 1 tablet (15  mg total) by mouth daily. 14 tablet 0   aspirin EC 81 MG tablet Take 1 tablet (81 mg total) by mouth daily. Swallow whole. 90 tablet 3   b complex vitamins capsule Take 1 capsule by mouth daily.     Calcium-Vitamin D-Vitamin K (CALCIUM SOFT CHEWS PO) Take 650 mg by mouth every other day.     Cholecalciferol (VITAMIN D) 125 MCG (5000 UT) CAPS Take 5,000 Units by mouth daily.     CREATINE PO Take by mouth.     MAGNESIUM PO Take 500 mg by mouth daily.     Menaquinone-7 (VITAMIN K2) 100 MCG CAPS Take 100 mcg by mouth daily.     metoprolol tartrate (LOPRESSOR) 25 MG tablet Take 25 mg by mouth daily as needed (AFIB or High Heart Rate).     Misc Natural Products (GLUCOSAMINE CHOND MSM FORMULA PO) Take 1 tablet by mouth daily.     Multiple Vitamins-Minerals (MULTIVITAMIN WITH MINERALS) tablet Take 1 tablet by mouth daily.     Omega-3 Fatty Acids (FISH OIL) 1200 MG CAPS Take 1,200 mg by mouth daily.     omeprazole (PRILOSEC OTC) 20 MG tablet Take 20 mg by mouth daily.     Probiotic Product (PROBIOTIC DAILY PO) Take 1 capsule by mouth daily.      Psyllium (METAMUCIL FIBER PO) Take 2 Scoops by mouth daily.     Taurine 1000 MG CAPS Take 1,000 mg by mouth daily.     vitamin C (ASCORBIC ACID) 500 MG tablet Take 500 mg by mouth daily.     No current facility-administered medications for this visit.   No results found.  Review of Systems:   A ROS was performed including pertinent positives and negatives as documented in the HPI.  Physical Exam :   Constitutional: NAD and appears stated age Neurological: Alert and oriented Psych: Appropriate affect and cooperative There were no vitals taken for this visit.   Comprehensive Musculoskeletal Exam:    Tenderness to palpation about the lateral epicondyle of the right elbow.  Pain with resisted extension of the right hand.  Left elbow he is tender about the distal biceps centrally at the insertion about the radial tuberosity.  There is pain with resisted supination.  Right thumb is tender with a positive CMC grind at the base of the right thumb  Imaging:   Xray (3 views right hand, right elbow 3 views, left elbow 3 views): Right CMC base arthritis which is mild in nature, normal elbows   I personally reviewed and interpreted the radiographs.   Assessment:   62 y.o. male with right lateral epicondylitis as well as left elbow distal biceps tendinitis.  He does have evidence of right CMC arthritis as well.  At today's visit I did recommend a CMC base injection as well as a thumb base splint in addition to right elbow stretches for tennis elbow and a lateral epicondylar injection.  With the left elbow distal biceps insertional tendinitis I did discuss that he may be a candidate ultimately for shock therapy versus PRP or both.  I would like to send him to Dr. Shon Baton for an assessment of this left elbow distal biceps tendinitis.  I will plan to see him back as needed should the injections wear off on his right elbow or right thumb.  Plan :    -Right elbow, right thumb injection after verbal  consent obtained     Procedure Note  Patient: Joseph Brennan  Date of Birth: Jun 12, 1961           MRN: [id number]             Visit Date: 06/13/2023  Procedures: Visit Diagnoses:  1. Pain of both elbows     Hand/UE Inj for osteoarthritis on 06/13/2023 11:45 AM Details: ultrasound-guided   Medium Joint Inj: R lateral epicondyle on 06/13/2023 11:45 AM Indications: pain Details: 22 G 1.5 in needle, ultrasound-guided lateral approach Medications: 4 mL lidocaine 1 %; 80 mg triamcinolone acetonide 40 MG/ML Outcome: tolerated well, no immediate complications Consent was given by the patient. Immediately prior to procedure a time out was called to verify the correct patient, procedure, equipment, support staff and site/side marked as required. Patient was prepped and draped in the usual sterile fashion.         I personally saw and evaluated the patient, and participated in the management and treatment plan.  Huel Cote, MD Attending Physician, Orthopedic Surgery  This document was dictated using Dragon voice recognition software. A reasonable attempt at proof reading has been made to minimize errors.

## 2023-08-08 ENCOUNTER — Ambulatory Visit: Payer: BC Managed Care – PPO | Admitting: Sports Medicine

## 2023-08-08 ENCOUNTER — Encounter: Payer: Self-pay | Admitting: Sports Medicine

## 2023-08-08 DIAGNOSIS — M7522 Bicipital tendinitis, left shoulder: Secondary | ICD-10-CM

## 2023-08-08 NOTE — Progress Notes (Signed)
Left elbow pain; referral per [facility] Xrays from 06/17/23 in chart of elbow Pain is somewhat improved since seeing Dr. Steward Drone as he has rested it and used OTC tylenol for pain control

## 2023-08-08 NOTE — Progress Notes (Signed)
Travan Ramaswamy - 62 y.o. male MRN [id number]  Date of birth: March 03, 1961  Office Visit Note: Visit Date: 08/08/2023 PCP: Charlane Ferretti, DO Referred by: Charlane Ferretti, DO  Subjective: Chief Complaint  Patient presents with   Left Elbow - Pain   HPI: Dixon Vroom "Joseph Brennan" is a pleasant 62 y.o. male who presents today for left elbow and arm pain. He is a very fit and active individual.  For left distal bicep tendon.  Previously saw my partner, Dr. Steward Drone, did have a right lateral epicondyle and right CMC injection and these significantly helped his pain.  He is having lingering distal bicep pain.  This started in early July when he was getting into lifting and working out.  No pop or specific injury but following this she had progressive soreness that has continued into pain.  There is pain with elbow flexion and supination.  He has been resting the elbow more so over the last 2 weeks and it has improved his pain.  He is taking Tylenol only as needed.  Pertinent ROS were reviewed with the patient and found to be negative unless otherwise specified above in HPI.   Assessment & Plan: Visit Diagnoses:  1. Biceps tendinitis of left shoulder    Plan: Discussed with Joseph Brennan his exam and history is very indicative of left distal biceps tendinitis.  He has an appropriate hook test so I am not worried about any high-grade tearing.  He has gotten somewhat better with rest and activity modification.  Patient decision-making, did proceed with a trial of extracorporeal shockwave therapy.  I would like to see him back in about 1 week later to repeat treatment and after 2 sessions see the degree of cumulative benefit.  We will get him started into formalized physical therapy, I think he would only need a few sessions to show exactly what she should and should not be doing and then can transition to a home exercise program.  He will follow-up in 1 week.  Will hold on medication for now, but could consider a  short course of low-dose prednisone for meloxicam/Celebrex to help with inflammatory component.  Follow-up: Return in about 1 week (around 08/15/2023) for for L-distal bicep (reg visit) (ok to do Thursday at (9:45 inj slot if pt able).   Meds & Orders: No orders of the defined types were placed in this encounter.   Orders Placed This Encounter  Procedures   Ambulatory referral to Physical Therapy     Procedures: Procedure: ECSWT Indications: Left distal bicep tendinitis   Procedure Details Consent: Risks of procedure as well as the alternatives and risks of each were explained to the patient.  Verbal consent for procedure obtained. Time Out: Verified patient identification, verified procedure, site was marked, verified correct patient position. The area was cleaned with alcohol swab.     The left distal bicep was targeted for Extracorporeal shockwave therapy.    Preset: Tendinitis Power Level: 110 mJ Frequency: 10 Hz Impulse/cycles: 2800 Head size: Regular   Patient tolerated procedure well without immediate complications.       Clinical History: No specialty comments available.  He reports that he has never smoked. He has never used smokeless tobacco. No results for input(s): "HGBA1C", "LABURIC" in the last 8760 hours.  Objective:    Physical Exam  Gen: Well-appearing, in no acute distress; non-toxic CV:  Well-perfused. Warm.  Resp: Breathing unlabored on room air; no wheezing. Psych: Fluid speech in conversation; appropriate affect; normal thought process  Neuro: Sensation intact throughout. No gross coordination deficits.   Ortho Exam - Left elbow: No bony TTP.  There is no swelling or effusion.  There is some TTP over the distal aspect of the bicep. + Hook test (able to grab tendon).  There is pain with resisted flexion and supination about the upper extremity.  Full range of motion without restriction of the elbow, no instability.  Imaging:  DG Hand Complete  Right CLINICAL DATA:  Chronic right hand pain.  EXAM: RIGHT HAND - COMPLETE 3+ VIEW  COMPARISON:  Right fifth finger radiographs 06/03/2023  FINDINGS: 1 mm ulnar negative variance. Mild-to-moderate thumb carpometacarpal joint space narrowing, subchondral sclerosis, and peripheral osteophytosis. Moderate third and fifth and mild-to-moderate second DIP joint space narrowing, subchondral sclerosis, and peripheral osteophytosis. No acute fracture or dislocation.  IMPRESSION: Mild-to-moderate thumb carpometacarpal and second through fifth DIP joint osteoarthritis.  Electronically Signed   By: Neita Garnet M.D.   On: 06/17/2023 19:14 DG Elbow 2 Views Left CLINICAL DATA:  Chronic bilateral elbow pain.  EXAM: LEFT ELBOW - 2 VIEW  COMPARISON:  None Available.  FINDINGS: Normal bone mineralization. Normal position of the distal anterior humeral fat pad without evidence of elbow joint effusion. Joint spaces are preserved. No acute fracture or dislocation.  IMPRESSION: Normal left elbow radiographs.  Electronically Signed   By: Neita Garnet M.D.   On: 06/17/2023 19:12 DG Elbow 2 Views Right CLINICAL DATA:  Chronic bilateral elbow pain.  EXAM: RIGHT ELBOW - 2 VIEW  COMPARISON:  None Available.  FINDINGS: Normal bone mineralization. Normal position of the distal anterior humeral fat pad without evidence of elbow joint effusion. Joint spaces are preserved. No acute fracture or dislocation.  IMPRESSION: Normal right elbow radiographs.  Electronically Signed   By: Neita Garnet M.D.   On: 06/17/2023 19:11  Past Medical/Family/Surgical/Social History: Medications & Allergies reviewed per EMR, new medications updated. Patient Active Problem List   Diagnosis Date Noted   Paroxysmal atrial fibrillation (HCC) 05/24/2022   GERD 06/04/2010   DYSPHAGIA 04/27/2010   Past Medical History:  Diagnosis Date   A-fib [facility])    Anxiety    Arthritis    Chest pain     Diverticulitis    August 2021   Diverticulosis    Fatigue    GERD (gastroesophageal reflux disease)    Hand arthritis    Hematuria    Insomnia    Lightheadedness    Family History  Problem Relation Age of Onset   Breast cancer Mother    Heart disease Father    Diabetes Father    Prostate cancer Father    Skin cancer Father    Healthy Sister    Healthy Brother    Heart disease Maternal Grandfather    Heart disease Paternal Grandfather    Healthy Brother    Colon cancer Neg Hx    Pancreatic cancer Neg Hx    Liver disease Neg Hx    Esophageal cancer Neg Hx    Stomach cancer Neg Hx    Colon polyps Neg Hx    Rectal cancer Neg Hx    Past Surgical History:  Procedure Laterality Date   ADENOIDECTOMY     ATRIAL FIBRILLATION ABLATION N/A 04/26/2022   Procedure: ATRIAL FIBRILLATION ABLATION;  Surgeon: Lanier Prude, MD;  Location: [facility];  Service: Cardiovascular;  Laterality: N/A;   ESOPHAGEAL DILATION     right knee arthroscopy     Dr. Rennis Chris for mensicus injury  UPPER GASTROINTESTINAL ENDOSCOPY  2011   Social History   Occupational History   Occupation: Firefighter  Tobacco Use   Smoking status: Never   Smokeless tobacco: Never  Vaping Use   Vaping status: Never Used  Substance and Sexual Activity   Alcohol use: Not Currently   Drug use: No   Sexual activity: Yes    Partners: Female    Birth control/protection: None    Comment: married

## 2023-08-19 ENCOUNTER — Ambulatory Visit (INDEPENDENT_AMBULATORY_CARE_PROVIDER_SITE_OTHER): Payer: BC Managed Care – PPO | Admitting: Sports Medicine

## 2023-08-19 ENCOUNTER — Encounter: Payer: Self-pay | Admitting: Sports Medicine

## 2023-08-19 DIAGNOSIS — M7522 Bicipital tendinitis, left shoulder: Secondary | ICD-10-CM | POA: Diagnosis not present

## 2023-08-19 NOTE — Progress Notes (Signed)
Charod Linquist - 62 y.o. male MRN [id number]  Date of birth: [birth year]  Office Visit Note: Visit Date: 08/19/2023 PCP: Charlane Ferretti, DO Referred by: Charlane Ferretti, DO  Subjective: Chief Complaint  Patient presents with   Arm Pain    Patient stated that shockwave therapy helped for about a day before his arm started bothering him again. Patient also states that his hand has been bothering him from where he got an injection at [facility].   HPI: Vasilis Relles is a pleasant 62 y.o. male who presents today for distal biceps tendinitis of left arm. Also with thumb pain.  Did notice some improvement with shockwave therapy for the first day or 2 but then feels like it is worn off and is still been somewhat tender like before.  He is back in the gym but he is holding off on all bicep curls and modifying other activities that cause pain.  He is not taking any medications for this currently.  Pertinent ROS were reviewed with the patient and found to be negative unless otherwise specified above in HPI.   Assessment & Plan: Visit Diagnoses:  1. Biceps tendinitis of left shoulder    Plan: [name] received some temporary relief from extracorporeal shockwave therapy after the first session, we did repeat ESWT today.  I would like to see how he does after 2 sessions to see if he is receiving any longer-term cumulative benefit.  We did send a referral to physical therapy, he has his first upcoming appointment on 08/29/2023.  He will continue appropriate activity modification and we will follow-up in 1 week for repeat evaluation.  If he is making improvement, we will consider additional shockwave treatments.  Could consider short course of meloxicam or Celebrex to help with inflammatory component.  Also discussed role for PRP injection therapy.  Follow-up: Return in about 1 week (around 08/26/2023) for for L-distal bicep .   Meds & Orders: No orders of the defined types were placed in this encounter.  No  orders of the defined types were placed in this encounter.    Procedures: Procedure: ECSWT Indications: Left distal bicep tendinitis   Procedure Details Consent: Risks of procedure as well as the alternatives and risks of each were explained to the patient.  Verbal consent for procedure obtained. Time Out: Verified patient identification, verified procedure, site was marked, verified correct patient position. The area was cleaned with alcohol swab.     The left distal bicep was targeted for Extracorporeal shockwave therapy.    Preset: Tendinitis Power Level: 110 mJ Frequency: 10 Hz Impulse/cycles: 2800 Head size: Regular   Patient tolerated procedure well without immediate complications.      Clinical History: No specialty comments available.  He reports that he has never smoked. He has never used smokeless tobacco. No results for input(s): "HGBA1C", "LABURIC" in the last 8760 hours.  Objective:   Vital Signs: There were no vitals taken for this visit.  Physical Exam  Gen: Well-appearing, in no acute distress; non-toxic CV: Well-perfused. Warm.  Resp: Breathing unlabored on room air; no wheezing. Psych: Fluid speech in conversation; appropriate affect; normal thought process Neuro: Sensation intact throughout. No gross coordination deficits.   Ortho Exam -Full range of motion of the elbow.  Some tenderness to palpation over the distal bicep.  Positive hook test (able to grab tendon). No elbow swelling.  Imaging: No results found.  Past Medical/Family/Surgical/Social History: Medications & Allergies reviewed per EMR, new medications updated. Patient Active Problem List  Diagnosis Date Noted   Paroxysmal atrial fibrillation (HCC) 05/24/2022   GERD 06/04/2010   DYSPHAGIA 04/27/2010   Past Medical History:  Diagnosis Date   A-fib [facility])    Anxiety    Arthritis    Chest pain    Diverticulitis    August 2021   Diverticulosis    Fatigue    GERD (gastroesophageal  reflux disease)    Hand arthritis    Hematuria    Insomnia    Lightheadedness    Family History  Problem Relation Age of Onset   Breast cancer Mother    Heart disease Father    Diabetes Father    Prostate cancer Father    Skin cancer Father    Healthy Sister    Healthy Brother    Heart disease Maternal Grandfather    Heart disease Paternal Grandfather    Healthy Brother    Colon cancer Neg Hx    Pancreatic cancer Neg Hx    Liver disease Neg Hx    Esophageal cancer Neg Hx    Stomach cancer Neg Hx    Colon polyps Neg Hx    Rectal cancer Neg Hx    Past Surgical History:  Procedure Laterality Date   ADENOIDECTOMY     ATRIAL FIBRILLATION ABLATION N/A 04/26/2022   Procedure: ATRIAL FIBRILLATION ABLATION;  Surgeon: Lanier Prude, MD;  Location: [facility];  Service: Cardiovascular;  Laterality: N/A;   ESOPHAGEAL DILATION     right knee arthroscopy     Dr. Rennis Chris for mensicus injury   UPPER GASTROINTESTINAL ENDOSCOPY  2011   Social History   Occupational History   Occupation: Firefighter  Tobacco Use   Smoking status: Never   Smokeless tobacco: Never  Vaping Use   Vaping status: Never Used  Substance and Sexual Activity   Alcohol use: Not Currently   Drug use: No   Sexual activity: Yes    Partners: Female    Birth control/protection: None    Comment: married

## 2023-08-26 ENCOUNTER — Encounter: Payer: Self-pay | Admitting: Sports Medicine

## 2023-08-26 ENCOUNTER — Other Ambulatory Visit (HOSPITAL_BASED_OUTPATIENT_CLINIC_OR_DEPARTMENT_OTHER): Payer: Self-pay

## 2023-08-26 ENCOUNTER — Ambulatory Visit (INDEPENDENT_AMBULATORY_CARE_PROVIDER_SITE_OTHER): Payer: BC Managed Care – PPO | Admitting: Sports Medicine

## 2023-08-26 DIAGNOSIS — M25522 Pain in left elbow: Secondary | ICD-10-CM | POA: Diagnosis not present

## 2023-08-26 DIAGNOSIS — I480 Paroxysmal atrial fibrillation: Secondary | ICD-10-CM

## 2023-08-26 DIAGNOSIS — M7522 Bicipital tendinitis, left shoulder: Secondary | ICD-10-CM | POA: Diagnosis not present

## 2023-08-26 DIAGNOSIS — M7918 Myalgia, other site: Secondary | ICD-10-CM

## 2023-08-26 MED ORDER — MELOXICAM 15 MG PO TABS
15.0000 mg | ORAL_TABLET | Freq: Every day | ORAL | 0 refills | Status: AC
  Filled 2023-08-26: qty 21, 21d supply, fill #0

## 2023-08-26 MED ORDER — PREDNISONE 20 MG PO TABS
20.0000 mg | ORAL_TABLET | Freq: Every day | ORAL | 0 refills | Status: AC
  Filled 2023-08-26: qty 7, 7d supply, fill #0

## 2023-08-26 NOTE — Progress Notes (Signed)
Joseph Brennan - 62 y.o. male MRN [id number]  Date of birth: 08-14-1961  Office Visit Note: Visit Date: 08/26/2023 PCP: Charlane Ferretti, DO Referred by: Charlane Ferretti, DO  Subjective: Chief Complaint  Patient presents with   Left Elbow - Follow-up, Pain   HPI: Joseph Brennan is a pleasant 62 y.o. male who presents today for left elbow and arm pain.  We had performed 2 sessions of extracorporeal shockwave therapy for his distal biceps tendinitis and elbow pain.  At this point, he He has any pain within the bicep tendon.  His pain is migrated and now localized just lateral to this, pointing towards the brachial radialis musculature.  He tried getting back into some curling and gripping activities of the upper extremity which caused him pain.  We did send a previous physical therapy referral, but has his first PT appointment this upcoming Friday.  He does have a history of paroxysmal A-fib.  In years past did have an ablation which significantly improved his symptoms but was told to be cautious with longer-term NSAIDs and other medications as such.  Pertinent ROS were reviewed with the patient and found to be negative unless otherwise specified above in HPI.   Assessment & Plan: Visit Diagnoses:  1. Pain in left elbow   2. Brachioradialis muscle tenderness   3. Biceps tendinitis of left shoulder   4. Paroxysmal atrial fibrillation (HCC)    Plan: Joseph Brennan received essentially complete relief of his distal biceps tendinitis with extracorporeal shockwave therapy.  More of his pain has now migrated to the brachial radialis and associated lateral elbow musculature, he has tenderness to palpation fear and with provocative maneuver.  I would like him to get into formalized physical therapy and they will help guide his lifting activities and give him some home exercises to perform on his own.  Given the exacerbation of the pain, I do think we need a role for anti-inflammatories.  Will start him on  prednisone 20 mg to be taken once daily for 7 days and then discontinue.  Following this he will take meloxicam 15 mg once daily with food for 2 additional weeks and then stop.  Will do lower dosing cannot aggravate his paroxysmal A-fib.  Return precautions provided.  I would like to follow-up in about 1 month after he completes this treatment and has a few sessions of formalized physical therapy. I am hoping he will be largely improved but if he is still having issues, could consider MRI of the elbow.  Follow-up: Return in about 4 weeks (around 09/23/2023) for L-elbow.   Meds & Orders:  Meds ordered this encounter  Medications   predniSONE (DELTASONE) 20 MG tablet    Sig: Take 1 tablet (20 mg total) by mouth daily with breakfast for 7 days.    Dispense:  7 tablet    Refill:  0   meloxicam (MOBIC) 15 MG tablet    Sig: Take 1 tablet (15 mg total) by mouth daily. Take daily x 2 weeks; start AFTER completing prednisone    Dispense:  21 tablet    Refill:  0   No orders of the defined types were placed in this encounter.    Procedures: No procedures performed      Clinical History: No specialty comments available.  He reports that he has never smoked. He has never used smokeless tobacco. No results for input(s): "HGBA1C", "LABURIC" in the last 8760 hours.  Objective:   Vital Signs: There were no vitals taken  for this visit.  Physical Exam  Gen: Well-appearing, in no acute distress; non-toxic CV: Well-perfused. Warm.  Resp: Breathing unlabored on room air; no wheezing. Psych: Fluid speech in conversation; appropriate affect; normal thought process Neuro: Sensation intact throughout. No gross coordination deficits.   Ortho Exam - Left elbow: There is full range of motion with flexion and extension of the elbow.  No varus or valgus instability.  There is no longer any tenderness to palpation over the distal bicep.  Positive hook test as I am able to grab the tendon without difficulty.   There is no swelling or effusion of the elbow.  Positive TTP over the lateral flexor wad and overlying brachial radialis musculature.  Imaging: No results found.  Past Medical/Family/Surgical/Social History: Medications & Allergies reviewed per EMR, new medications updated. Patient Active Problem List   Diagnosis Date Noted   Paroxysmal atrial fibrillation (HCC) 05/24/2022   GERD 06/04/2010   DYSPHAGIA 04/27/2010   Past Medical History:  Diagnosis Date   A-fib [facility])    Anxiety    Arthritis    Chest pain    Diverticulitis    August 2021   Diverticulosis    Fatigue    GERD (gastroesophageal reflux disease)    Hand arthritis    Hematuria    Insomnia    Lightheadedness    Family History  Problem Relation Age of Onset   Breast cancer Mother    Heart disease Father    Diabetes Father    Prostate cancer Father    Skin cancer Father    Healthy Sister    Healthy Brother    Heart disease Maternal Grandfather    Heart disease Paternal Grandfather    Healthy Brother    Colon cancer Neg Hx    Pancreatic cancer Neg Hx    Liver disease Neg Hx    Esophageal cancer Neg Hx    Stomach cancer Neg Hx    Colon polyps Neg Hx    Rectal cancer Neg Hx    Past Surgical History:  Procedure Laterality Date   ADENOIDECTOMY     ATRIAL FIBRILLATION ABLATION N/A 04/26/2022   Procedure: ATRIAL FIBRILLATION ABLATION;  Surgeon: Lanier Prude, MD;  Location: [facility];  Service: Cardiovascular;  Laterality: N/A;   ESOPHAGEAL DILATION     right knee arthroscopy     Dr. Rennis Chris for mensicus injury   UPPER GASTROINTESTINAL ENDOSCOPY  2011   Social History   Occupational History   Occupation: Firefighter  Tobacco Use   Smoking status: Never   Smokeless tobacco: Never  Vaping Use   Vaping status: Never Used  Substance and Sexual Activity   Alcohol use: Not Currently   Drug use: No   Sexual activity: Yes    Partners: Female    Birth control/protection: None     Comment: married

## 2023-08-26 NOTE — Progress Notes (Signed)
Patient states that the area that was treated with shockwave feels much better, but the other area of his arm does not. Patient points to medial aspect when describing improved pain, and points toward the lateral aspect when describing constant/worsening pain.

## 2023-08-28 NOTE — Therapy (Signed)
OUTPATIENT PHYSICAL THERAPY UE EVALUATION   Patient Name: Joseph Brennan MRN: [id number] DOB:[birth year], 62 y.o., male Today's Date: 08/29/2023  END OF SESSION:  PT End of Session - 08/29/23 0954     Visit Number 1    Number of Visits 6    Date for PT Re-Evaluation 10/24/23    Authorization Type BCBS    PT Start Time 0849    PT Stop Time 0938    PT Time Calculation (min) 49 min    Activity Tolerance Patient tolerated treatment well    Behavior During Therapy [facility] for tasks assessed/performed             Past Medical History:  Diagnosis Date   A-fib [facility])    Anxiety    Arthritis    Chest pain    Diverticulitis    August 2021   Diverticulosis    Fatigue    GERD (gastroesophageal reflux disease)    Hand arthritis    Hematuria    Insomnia    Lightheadedness    Past Surgical History:  Procedure Laterality Date   ADENOIDECTOMY     ATRIAL FIBRILLATION ABLATION N/A 04/26/2022   Procedure: ATRIAL FIBRILLATION ABLATION;  Surgeon: Lanier Prude, MD;  Location: [facility];  Service: Cardiovascular;  Laterality: N/A;   ESOPHAGEAL DILATION     right knee arthroscopy     Dr. Rennis Chris for mensicus injury   UPPER GASTROINTESTINAL ENDOSCOPY  2011   Patient Active Problem List   Diagnosis Date Noted   Paroxysmal atrial fibrillation (HCC) 05/24/2022   GERD 06/04/2010   DYSPHAGIA 04/27/2010     REFERRING PROVIDER: Madelyn Brunner, DO  REFERRING DIAG: [id number] (ICD-10-CM) - Biceps tendinitis of left shoulder Left distal bicep tendinitis    THERAPY DIAG:  Pain in left elbow  Muscle weakness (generalized)  Rationale for Evaluation and Treatment: Rehabilitation  ONSET DATE: June 2024  SUBJECTIVE:                                                                                                                                                                                      SUBJECTIVE STATEMENT: Pt states he has had R elbow pain for 4-5 years.  Pt reports his L  elbow pain began in June when he was increasing his resistance with gym exercises.   Pt saw Dr. Steward Drone in June and was dx'd with R lateral epicondylitis and L elbow distal biceps tendinitis.  MD note also indicated pt having R [facility] arthritis.  Pt received a R elbow and thumb injection.  Pt states the elbow injection was wonderful having great relief.  His thumb injection only lasted 3-4  weeks.  Pt was referred to Dr. Shon Baton for shockwave therapy vs PRP injections.  Pt received 2 sessions of extracorporeal shockwave therapy and reports significant relief.  He states pressing on the distal biceps doesn't hurt now, though was hurting prior to just to touch it.  MD prescribed prednisone and meloxciam.  Pt just started prednisone today and will start meloxicam when the prednisone finished.    Pt reports he is having more pain down the brachioradialis muscle.  He has pain with elbow mobility.  Pt works out 3 days per week.  He limits his lifting including avoiding biceps curls due to L biceps pain. Pt is limited with yard work and uses a brace. Pt has pain with using L UE with household chores.   Hand dominance: Left  PERTINENT HISTORY: A-fib with ablation 2023, anxiety, arthritis, R knee scope R thumb pain/arthritis  PAIN:  NPRS:  0/10 current at rest, 8/10 worst pain Worst pain with lifting weights Location:  distal lateral biceps, brachioradialis  PRECAUTIONS: Other: A-fib   WEIGHT BEARING RESTRICTIONS: No  FALLS:  Has patient fallen in last 6 months? No   OCCUPATION: Firefighter  PLOF: Independent  PATIENT GOALS:to be able to use L elbow as before he injured it   OBJECTIVE:   DIAGNOSTIC FINDINGS:  X rays of elbow in June: IMPRESSION: Normal right elbow radiographs.  IMPRESSION: Normal left elbow radiographs.  PATIENT SURVEYS:  FOTO 56 with a goal of 68 at visit 10  COGNITION: Overall cognitive status: Within functional limits for tasks assessed      UPPER  EXTREMITY ROM:   AROM Right eval Left eval  Shoulder flexion    Shoulder extension    Shoulder abduction    Shoulder adduction    Shoulder internal rotation    Shoulder external rotation    Elbow flexion 150 150  Elbow extension 0 AROM/PROM:  2/0  Wrist flexion    Wrist extension    Wrist ulnar deviation    Wrist radial deviation    Wrist pronation  WFL  Wrist supination  WFL  (Blank rows = not tested)  UPPER EXTREMITY MMT:  MMT Right eval Left eval  Shoulder flexion    Shoulder extension    Shoulder abduction    Shoulder adduction    Shoulder internal rotation    Shoulder external rotation    Middle trapezius    Lower trapezius    Elbow flexion 5/5 5/5  Elbow extension 5/5 4/5 with pain  Wrist flexion    Wrist extension    Brachioradialis  5/5 Tolerated min resistance with 7-8/10 pain  Wrist radial deviation    Wrist pronation  5/5  Wrist supination  5/5 with pain  Grip strength (lbs)    (Blank rows = not tested)    PALPATION:  TTP:  none in biceps or biceps tendon.  Pt is tender in L brachioradialis    TODAY'S TREATMENT:  Pt performed biceps curls with RTB focusing on eccentric phase 2x10 reps.  Pt had no pain with eccentric bicep curls with RTB.  Pt received a HEP handout and was educated in correct form and appropriate frequency.  PT instructed pt he should not have increased pain or irritation of the elbow/biceps.  PT instructed pt in using ice if he has increased pain or soreness.    PATIENT EDUCATION: Education details:  HEP, POC, dx, prognosis, ice usage, and rationale of interventions including eccentric exercises.  PT used pictures on the internet to educate pt concerning relevant anatomy.  Person educated: Patient Education method: Explanation, Demonstration, Verbal cues, and Handouts Education comprehension:  verbalized understanding, returned demonstration, and verbal cues required  HOME EXERCISE PROGRAM: Access Code: [email or web address] URL: [email or web address] Date: 08/29/2023 Prepared by: Aaron Edelman  Exercises - Standing Single Arm Elbow Flexion with Resistance  - 1 x daily - 5 x weekly - 2-3 sets - 10 reps  ASSESSMENT:  CLINICAL IMPRESSION: Patient is a 62 y.o. male with a dx of L distal biceps tendinitis presenting to the clinic with R elbow pain and weakness in brachioradialis.  Pt has a hx of R lateral epicondylitis which has improved greatly since the injection.  Pt has received 2 sessions of extracorporeal shockwave therapy and reports significant relief.  He continues to have tenderness and pain along the brachioradialis.  He reports pain with elbow mobility and performing household chores.  He works out consistently though is limited with lifting currently.  Pt is limited with yard work and uses a brace.  Pt should benefit from skilled PT services to address impairments and improve overall function.    OBJECTIVE IMPAIRMENTS: decreased activity tolerance, decreased strength, increased fascial restrictions, impaired UE functional use, and pain.   ACTIVITY LIMITATIONS: lifting  PARTICIPATION LIMITATIONS: cleaning and yard work  PERSONAL FACTORS: 1-2 comorbidities: A-fib and arthritis  are also affecting patient's functional outcome.   REHAB POTENTIAL: Good  CLINICAL DECISION MAKING: Stable/uncomplicated  EVALUATION COMPLEXITY: Low   GOALS:  SHORT TERM GOALS: Target date:  09/19/2023   Pt will report at least a 25% reduction in pain with functional usage of L UE. Baseline: Goal status: INITIAL  2.  Pt will report reduced tenderness to palpation of brachioradialis for improved inflammation and mobility.  Baseline:  Goal status: INITIAL Target date:  09/26/2023  3.  Pt will be independent and compliant with HEP for improved pain, strength, and function.   Baseline:  Goal status: INITIAL Target date:  09/26/2023   LONG TERM GOALS: Target date: 10/24/2023  Pt will be able to perform his household chores without significant pain and limitation.  Baseline:  Goal status: INITIAL  2.  Pt will report he is able to perform his yard work without significant pain.   Baseline:  Goal status: INITIAL  3.  Pt will demo 5/5 brachioradialis strength with reduced pain with testing for improved strength with performance of IADLs.  Baseline:  Goal status: INITIAL    PLAN:  PT FREQUENCY: 1x/week  PT DURATION: other: 6-8 weeks  PLANNED INTERVENTIONS: Therapeutic exercises, Therapeutic activity, Neuromuscular re-education, Patient/Family education, Self Care, Joint mobilization, Aquatic Therapy, Dry Needling, Spinal mobilization, Cryotherapy, Moist heat, Taping, Ultrasound, Manual therapy, and Re-evaluation  PLAN FOR NEXT SESSION: possible IASTM to brachioradialis and distal biceps if tolerable.  Review HEP.  Eccentric strength training.    Audie Clear III PT, DPT 08/30/23 10:31 AM

## 2023-08-29 ENCOUNTER — Ambulatory Visit (HOSPITAL_BASED_OUTPATIENT_CLINIC_OR_DEPARTMENT_OTHER): Payer: BC Managed Care – PPO | Attending: Sports Medicine | Admitting: Physical Therapy

## 2023-08-29 ENCOUNTER — Other Ambulatory Visit: Payer: Self-pay

## 2023-08-29 DIAGNOSIS — M7522 Bicipital tendinitis, left shoulder: Secondary | ICD-10-CM | POA: Insufficient documentation

## 2023-08-29 DIAGNOSIS — M6281 Muscle weakness (generalized): Secondary | ICD-10-CM

## 2023-08-29 DIAGNOSIS — M25522 Pain in left elbow: Secondary | ICD-10-CM | POA: Insufficient documentation

## 2023-08-29 DIAGNOSIS — M2681 Anterior soft tissue impingement: Secondary | ICD-10-CM | POA: Diagnosis not present

## 2023-09-03 ENCOUNTER — Ambulatory Visit (HOSPITAL_BASED_OUTPATIENT_CLINIC_OR_DEPARTMENT_OTHER): Payer: BC Managed Care – PPO

## 2023-09-03 ENCOUNTER — Encounter (HOSPITAL_BASED_OUTPATIENT_CLINIC_OR_DEPARTMENT_OTHER): Payer: Self-pay

## 2023-09-03 DIAGNOSIS — M6281 Muscle weakness (generalized): Secondary | ICD-10-CM

## 2023-09-03 DIAGNOSIS — M2681 Anterior soft tissue impingement: Secondary | ICD-10-CM | POA: Diagnosis not present

## 2023-09-03 DIAGNOSIS — M7522 Bicipital tendinitis, left shoulder: Secondary | ICD-10-CM | POA: Diagnosis not present

## 2023-09-03 DIAGNOSIS — M25522 Pain in left elbow: Secondary | ICD-10-CM

## 2023-09-03 NOTE — Therapy (Signed)
OUTPATIENT PHYSICAL THERAPY UE TREATMENT   Patient Name: Joseph Brennan MRN: [id number] DOB:28-Aug-1961, 62 y.o., male Today's Date: 09/03/2023  END OF SESSION:  PT End of Session - 09/03/23 1330     Visit Number 2    Number of Visits 6    Date for PT Re-Evaluation 10/24/23    Authorization Type BCBS    PT Start Time 1233    PT Stop Time 1316    PT Time Calculation (min) 43 min    Activity Tolerance Patient tolerated treatment well    Behavior During Therapy [facility] for tasks assessed/performed              Past Medical History:  Diagnosis Date   A-fib [facility])    Anxiety    Arthritis    Chest pain    Diverticulitis    August 2021   Diverticulosis    Fatigue    GERD (gastroesophageal reflux disease)    Hand arthritis    Hematuria    Insomnia    Lightheadedness    Past Surgical History:  Procedure Laterality Date   ADENOIDECTOMY     ATRIAL FIBRILLATION ABLATION N/A 04/26/2022   Procedure: ATRIAL FIBRILLATION ABLATION;  Surgeon: Lanier Prude, MD;  Location: [facility];  Service: Cardiovascular;  Laterality: N/A;   ESOPHAGEAL DILATION     right knee arthroscopy     Dr. Rennis Chris for mensicus injury   UPPER GASTROINTESTINAL ENDOSCOPY  2011   Patient Active Problem List   Diagnosis Date Noted   Paroxysmal atrial fibrillation (HCC) 05/24/2022   GERD 06/04/2010   DYSPHAGIA 04/27/2010     REFERRING PROVIDER: Madelyn Brunner, DO  REFERRING DIAG: [phone] (ICD-10-CM) - Biceps tendinitis of left shoulder Left distal bicep tendinitis    THERAPY DIAG:  Muscle weakness (generalized)  Pain in left elbow  Rationale for Evaluation and Treatment: Rehabilitation  ONSET DATE: June 2024  SUBJECTIVE:                                                                                                                                                                                      SUBJECTIVE STATEMENT:  Pt reports improved pain level since he has bene on Prednisone.  Has one more day left, then starts Meloxicam.   Eval: Pt states he has had R elbow pain for 4-5 years.  Pt reports his L elbow pain began in June when he was increasing his resistance with gym exercises.   Pt saw Dr. Steward Drone in June and was dx'd with R lateral epicondylitis and L elbow distal biceps tendinitis.  MD note also indicated pt having R CMC arthritis.  Pt received  a R elbow and thumb injection.  Pt states the elbow injection was wonderful having great relief.  His thumb injection only lasted 3-4 weeks.  Pt was referred to Dr. Shon Baton for shockwave therapy vs PRP injections.  Pt received 2 sessions of extracorporeal shockwave therapy and reports significant relief.  He states pressing on the distal biceps doesn't hurt now, though was hurting prior to just to touch it.  MD prescribed prednisone and meloxciam.  Pt just started prednisone today and will start meloxicam when the prednisone finished.    Pt reports he is having more pain down the brachioradialis muscle.  He has pain with elbow mobility.  Pt works out 3 days per week.  He limits his lifting including avoiding biceps curls due to L biceps pain. Pt is limited with yard work and uses a brace. Pt has pain with using L UE with household chores.   Hand dominance: Left  PERTINENT HISTORY: A-fib with ablation 2023, anxiety, arthritis, R knee scope R thumb pain/arthritis  PAIN:  NPRS:  0/10 current at rest, 8/10 worst pain Worst pain with lifting weights Location:  distal lateral biceps, brachioradialis  PRECAUTIONS: Other: A-fib   WEIGHT BEARING RESTRICTIONS: No  FALLS:  Has patient fallen in last 6 months? No   OCCUPATION: Firefighter  PLOF: Independent  PATIENT GOALS:to be able to use L elbow as before he injured it   OBJECTIVE:   DIAGNOSTIC FINDINGS:  X rays of elbow in June: IMPRESSION: Normal right elbow radiographs.  IMPRESSION: Normal left elbow radiographs.  PATIENT SURVEYS:  FOTO 56 with a goal  of 68 at visit 10  COGNITION: Overall cognitive status: Within functional limits for tasks assessed      UPPER EXTREMITY ROM:   AROM Right eval Left eval  Shoulder flexion    Shoulder extension    Shoulder abduction    Shoulder adduction    Shoulder internal rotation    Shoulder external rotation    Elbow flexion 150 150  Elbow extension 0 AROM/PROM:  2/0  Wrist flexion    Wrist extension    Wrist ulnar deviation    Wrist radial deviation    Wrist pronation  WFL  Wrist supination  WFL  (Blank rows = not tested)  UPPER EXTREMITY MMT:  MMT Right eval Left eval  Shoulder flexion    Shoulder extension    Shoulder abduction    Shoulder adduction    Shoulder internal rotation    Shoulder external rotation    Middle trapezius    Lower trapezius    Elbow flexion 5/5 5/5  Elbow extension 5/5 4/5 with pain  Wrist flexion    Wrist extension    Brachioradialis  5/5 Tolerated min resistance with 7-8/10 pain  Wrist radial deviation    Wrist pronation  5/5  Wrist supination  5/5 with pain  Grip strength (lbs)    (Blank rows = not tested)    PALPATION:  TTP:  none in biceps or biceps tendon.  Pt is tender in L brachioradialis    TODAY'S TREATMENT:  STM and IASTM using edge tool to both flexor and extensor forearm compartments.   Wrist flexor stretch 20 seconds x 3 Wrist extensor stretch 20 seconds x3 Flex bar rainbow and smiley x20ea Flex bar wrist flexion and extension x 20 each Forearm pronation and supination 3 pound dumbbell 2 x 10 HEP update  PATIENT EDUCATION: Education details:  HEP, POC, dx, prognosis, ice usage, and rationale of interventions including eccentric exercises.  PT used pictures on the internet to educate pt concerning relevant anatomy.  Person educated: Patient Education method: Explanation, Demonstration,  Verbal cues, and Handouts Education comprehension: verbalized understanding, returned demonstration, and verbal cues required  HOME EXERCISE PROGRAM: Access Code: [email or web address] URL: [email or web address] Date: 08/29/2023 Prepared by: Aaron Edelman  Exercises - Standing Single Arm Elbow Flexion with Resistance  - 1 x daily - 5 x weekly - 2-3 sets - 10 reps  ASSESSMENT:  CLINICAL IMPRESSION: Patient tender in both proximal anterior and posterior forearm.  Good tolerance for manual interventions to these areas.  Utilized edge tool as well as STM to better target affected area.  Instructed patient in wrist flexor and extensor stretching to perform as part of HEP.  Also instructed patient in self IASTM.  This initiated  gentle forearm strengthening without complaint of pain.  Instructed patient to gradually increase frequency of HEP starting with every other day.  Will monitor pain level as patient will be weaning off prednisone and taking meloxicam.  OBJECTIVE IMPAIRMENTS: decreased activity tolerance, decreased strength, increased fascial restrictions, impaired UE functional use, and pain.   ACTIVITY LIMITATIONS: lifting  PARTICIPATION LIMITATIONS: cleaning and yard work  PERSONAL FACTORS: 1-2 comorbidities: A-fib and arthritis  are also affecting patient's functional outcome.   REHAB POTENTIAL: Good  CLINICAL DECISION MAKING: Stable/uncomplicated  EVALUATION COMPLEXITY: Low   GOALS:  SHORT TERM GOALS: Target date:  09/19/2023   Pt will report at least a 25% reduction in pain with functional usage of L UE. Baseline: Goal status: INITIAL  2.  Pt will report reduced tenderness to palpation of brachioradialis for improved inflammation and mobility.  Baseline:  Goal status: INITIAL Target date:  09/26/2023  3.  Pt will be independent and compliant with HEP for improved pain, strength, and function.  Baseline:  Goal status: INITIAL Target date:  09/26/2023   LONG  TERM GOALS: Target date: 10/24/2023  Pt will be able to perform his household chores without significant pain and limitation.  Baseline:  Goal status: INITIAL  2.  Pt will report he is able to perform his yard work without significant pain.   Baseline:  Goal status: INITIAL  3.  Pt will demo 5/5 brachioradialis strength with reduced pain with testing for improved strength with performance of IADLs.  Baseline:  Goal status: INITIAL    PLAN:  PT FREQUENCY: 1x/week  PT DURATION: other: 6-8 weeks  PLANNED INTERVENTIONS: Therapeutic exercises, Therapeutic activity, Neuromuscular re-education, Patient/Family education, Self Care, Joint mobilization, Aquatic Therapy, Dry Needling, Spinal mobilization, Cryotherapy, Moist heat, Taping, Ultrasound, Manual therapy, and Re-evaluation  PLAN FOR NEXT SESSION: possible IASTM to brachioradialis and distal biceps if tolerable.  Review HEP.  Eccentric strength training.    Riki Altes, PTA  09/03/23 1:31 PM

## 2023-09-11 ENCOUNTER — Encounter (HOSPITAL_BASED_OUTPATIENT_CLINIC_OR_DEPARTMENT_OTHER): Payer: Self-pay

## 2023-09-11 ENCOUNTER — Ambulatory Visit (HOSPITAL_BASED_OUTPATIENT_CLINIC_OR_DEPARTMENT_OTHER): Payer: BC Managed Care – PPO

## 2023-09-11 DIAGNOSIS — M2681 Anterior soft tissue impingement: Secondary | ICD-10-CM | POA: Diagnosis not present

## 2023-09-11 DIAGNOSIS — M6281 Muscle weakness (generalized): Secondary | ICD-10-CM

## 2023-09-11 DIAGNOSIS — M7522 Bicipital tendinitis, left shoulder: Secondary | ICD-10-CM | POA: Diagnosis not present

## 2023-09-11 DIAGNOSIS — M25522 Pain in left elbow: Secondary | ICD-10-CM

## 2023-09-11 NOTE — Therapy (Signed)
OUTPATIENT PHYSICAL THERAPY UE TREATMENT   Patient Name: Joseph Brennan MRN: [id number] DOB:1961-08-04, 62 y.o., male Today's Date: 09/11/2023  END OF SESSION:  PT End of Session - 09/11/23 1707     Visit Number 3    Number of Visits 6    Date for PT Re-Evaluation 10/24/23    Authorization Type BCBS    PT Start Time 1608    PT Stop Time 1650    PT Time Calculation (min) 42 min    Activity Tolerance Patient tolerated treatment well    Behavior During Therapy [facility] for tasks assessed/performed               Past Medical History:  Diagnosis Date   A-fib [facility])    Anxiety    Arthritis    Chest pain    Diverticulitis    August 2021   Diverticulosis    Fatigue    GERD (gastroesophageal reflux disease)    Hand arthritis    Hematuria    Insomnia    Lightheadedness    Past Surgical History:  Procedure Laterality Date   ADENOIDECTOMY     ATRIAL FIBRILLATION ABLATION N/A 04/26/2022   Procedure: ATRIAL FIBRILLATION ABLATION;  Surgeon: Lanier Prude, MD;  Location: [facility] INV[facility];  Service: Cardiovascular;  Laterality: N/A;   ESOPHAGEAL DILATION     right knee arthroscopy     Dr. Rennis Chris for mensicus injury   UPPER GASTROINTESTINAL ENDOSCOPY  2011   Patient Active Problem List   Diagnosis Date Noted   Paroxysmal atrial fibrillation (HCC) 05/24/2022   GERD 06/04/2010   DYSPHAGIA 04/27/2010     REFERRING PROVIDER: Madelyn Brunner, DO  REFERRING DIAG: [phone] (ICD-10-CM) - Biceps tendinitis of left shoulder Left distal bicep tendinitis    THERAPY DIAG:  Pain in left elbow  Muscle weakness (generalized)  Rationale for Evaluation and Treatment: Rehabilitation  ONSET DATE: June 2024  SUBJECTIVE:                                                                                                                                                                                      SUBJECTIVE STATEMENT:  Pt has now been taking Meloxicam since he has finished his  prednisone. Pt reports significant improvement in pain level since last session. "I think the massage really helped as well as the stretches." Pt reports compliance with HEP.   Eval: Pt states he has had R elbow pain for 4-5 years.  Pt reports his L elbow pain began in June when he was increasing his resistance with gym exercises.   Pt saw Dr. Steward Drone in June and was dx'd with R lateral  epicondylitis and L elbow distal biceps tendinitis.  MD note also indicated pt having R CMC arthritis.  Pt received a R elbow and thumb injection.  Pt states the elbow injection was wonderful having great relief.  His thumb injection only lasted 3-4 weeks.  Pt was referred to Dr. Shon Baton for shockwave therapy vs PRP injections.  Pt received 2 sessions of extracorporeal shockwave therapy and reports significant relief.  He states pressing on the distal biceps doesn't hurt now, though was hurting prior to just to touch it.  MD prescribed prednisone and meloxciam.  Pt just started prednisone today and will start meloxicam when the prednisone finished.    Pt reports he is having more pain down the brachioradialis muscle.  He has pain with elbow mobility.  Pt works out 3 days per week.  He limits his lifting including avoiding biceps curls due to L biceps pain. Pt is limited with yard work and uses a brace. Pt has pain with using L UE with household chores.   Hand dominance: Left  PERTINENT HISTORY: A-fib with ablation 2023, anxiety, arthritis, R knee scope R thumb pain/arthritis  PAIN:  NPRS:  0/10 current at rest, 8/10 worst pain Worst pain with lifting weights Location:  distal lateral biceps, brachioradialis  PRECAUTIONS: Other: A-fib   WEIGHT BEARING RESTRICTIONS: No  FALLS:  Has patient fallen in last 6 months? No   OCCUPATION: Firefighter  PLOF: Independent  PATIENT GOALS:to be able to use L elbow as before he injured it   OBJECTIVE:   DIAGNOSTIC FINDINGS:  X rays of elbow in  June: IMPRESSION: Normal right elbow radiographs.  IMPRESSION: Normal left elbow radiographs.  PATIENT SURVEYS:  FOTO 56 with a goal of 68 at visit 10  COGNITION: Overall cognitive status: Within functional limits for tasks assessed      UPPER EXTREMITY ROM:   AROM Right eval Left eval  Shoulder flexion    Shoulder extension    Shoulder abduction    Shoulder adduction    Shoulder internal rotation    Shoulder external rotation    Elbow flexion 150 150  Elbow extension 0 AROM/PROM:  2/0  Wrist flexion    Wrist extension    Wrist ulnar deviation    Wrist radial deviation    Wrist pronation  WFL  Wrist supination  WFL  (Blank rows = not tested)  UPPER EXTREMITY MMT:  MMT Right eval Left eval L 9/26  Shoulder flexion     Shoulder extension     Shoulder abduction     Shoulder adduction     Shoulder internal rotation     Shoulder external rotation     Middle trapezius     Lower trapezius     Elbow flexion 5/5 5/5   Elbow extension 5/5 4/5 with pain   Wrist flexion   5/5  Wrist extension   5/5  Brachioradialis  5/5 Tolerated min resistance with 7-8/10 pain   Wrist radial deviation     Wrist pronation  5/5   Wrist supination  5/5 with pain   Grip strength (lbs)     (Blank rows = not tested)   Grip strength: L:116lbs force 9/26    R: 115lbs force 9/26   PALPATION:  TTP:  none in biceps or biceps tendon.  Pt is tender in L brachioradialis    TODAY'S TREATMENT:  STM and IASTM using edge tool to both flexor and extensor forearm compartments.   Wrist flexor stretch 30 seconds x 3 Wrist extensor stretch 30 seconds x3 Wrist flexion/extension 5lb dumbbell 3x10ea Pronation/supination (used roller with 2.5# cuf weight on one end) x10ea Supine triceps extension 5# 3x10  PATIENT EDUCATION: Education details:  HEP, POC, dx,  prognosis, ice usage, and rationale of interventions including eccentric exercises.  PT used pictures on the internet to educate pt concerning relevant anatomy.  Person educated: Patient Education method: Explanation, Demonstration, Verbal cues, and Handouts Education comprehension: verbalized understanding, returned demonstration, and verbal cues required  HOME EXERCISE PROGRAM: Access Code: [email or web address] URL: [email or web address] Date: 08/29/2023 Prepared by: Aaron Edelman  Exercises - Standing Single Arm Elbow Flexion with Resistance  - 1 x daily - 5 x weekly - 2-3 sets - 10 reps  ASSESSMENT:  CLINICAL IMPRESSION: Continued with IASTM using a tool focusing on anterior forearm.  Patient reports improvement in tightness following STM and IASTM.  Gently progressed forearm strengthening without complaint of increased pain.  Instructed patient to continue avoiding high-level activities at this time until strength improves.  Tested grip strength today with minimal difference between each extremity.  Patient will continue to benefit from PT as we progress strengthening back to prior level.  OBJECTIVE IMPAIRMENTS: decreased activity tolerance, decreased strength, increased fascial restrictions, impaired UE functional use, and pain.   ACTIVITY LIMITATIONS: lifting  PARTICIPATION LIMITATIONS: cleaning and yard work  PERSONAL FACTORS: 1-2 comorbidities: A-fib and arthritis  are also affecting patient's functional outcome.   REHAB POTENTIAL: Good  CLINICAL DECISION MAKING: Stable/uncomplicated  EVALUATION COMPLEXITY: Low   GOALS:  SHORT TERM GOALS: Target date:  09/19/2023   Pt will report at least a 25% reduction in pain with functional usage of L UE. Baseline: Goal status: INITIAL  2.  Pt will report reduced tenderness to palpation of brachioradialis for improved inflammation and mobility.  Baseline:  Goal status: INITIAL Target date:  09/26/2023  3.  Pt will be  independent and compliant with HEP for improved pain, strength, and function.  Baseline:  Goal status: INITIAL Target date:  09/26/2023   LONG TERM GOALS: Target date: 10/24/2023  Pt will be able to perform his household chores without significant pain and limitation.  Baseline:  Goal status: INITIAL  2.  Pt will report he is able to perform his yard work without significant pain.   Baseline:  Goal status: INITIAL  3.  Pt will demo 5/5 brachioradialis strength with reduced pain with testing for improved strength with performance of IADLs.  Baseline:  Goal status: INITIAL    PLAN:  PT FREQUENCY: 1x/week  PT DURATION: other: 6-8 weeks  PLANNED INTERVENTIONS: Therapeutic exercises, Therapeutic activity, Neuromuscular re-education, Patient/Family education, Self Care, Joint mobilization, Aquatic Therapy, Dry Needling, Spinal mobilization, Cryotherapy, Moist heat, Taping, Ultrasound, Manual therapy, and Re-evaluation  PLAN FOR NEXT SESSION: possible IASTM to brachioradialis and distal biceps if tolerable.  Review HEP.  Eccentric strength training.    Riki Altes, PTA  09/11/23 5:08 PM

## 2023-09-18 ENCOUNTER — Ambulatory Visit (INDEPENDENT_AMBULATORY_CARE_PROVIDER_SITE_OTHER): Payer: BC Managed Care – PPO | Admitting: Audiology

## 2023-09-18 NOTE — Progress Notes (Signed)
Patient was seen today for a hearing aid check. Upon inspection, the right 0P receiver was broken. Replaced the receiver and dome. Patient noticed an improvment in sound quality. He denied needing any reprogramming today and was happy with the sound quality. Instructed him to call if any concerns arise.  Conley Rolls Beyonca Wisz, AUD  09/18/23

## 2023-09-19 ENCOUNTER — Ambulatory Visit (HOSPITAL_BASED_OUTPATIENT_CLINIC_OR_DEPARTMENT_OTHER): Payer: BC Managed Care – PPO | Attending: Sports Medicine

## 2023-09-19 ENCOUNTER — Encounter (HOSPITAL_BASED_OUTPATIENT_CLINIC_OR_DEPARTMENT_OTHER): Payer: Self-pay

## 2023-09-19 DIAGNOSIS — M6281 Muscle weakness (generalized): Secondary | ICD-10-CM | POA: Insufficient documentation

## 2023-09-19 DIAGNOSIS — M25522 Pain in left elbow: Secondary | ICD-10-CM | POA: Diagnosis not present

## 2023-09-19 NOTE — Therapy (Signed)
OUTPATIENT PHYSICAL THERAPY UE TREATMENT   Patient Name: Joseph Brennan MRN: [id number] DOB:1961/04/30, 62 y.o., male Today's Date: 09/19/2023  END OF SESSION:  PT End of Session - 09/19/23 0843     Visit Number 4    Number of Visits 6    Date for PT Re-Evaluation 10/24/23    Authorization Type BCBS    PT Start Time [phone]    PT Stop Time 0930    PT Time Calculation (min) 40 min    Activity Tolerance Patient tolerated treatment well    Behavior During Therapy [facility] for tasks assessed/performed               Past Medical History:  Diagnosis Date   A-fib [facility])    Anxiety    Arthritis    Chest pain    Diverticulitis    August 2021   Diverticulosis    Fatigue    GERD (gastroesophageal reflux disease)    Hand arthritis    Hematuria    Insomnia    Lightheadedness    Past Surgical History:  Procedure Laterality Date   ADENOIDECTOMY     ATRIAL FIBRILLATION ABLATION N/A 04/26/2022   Procedure: ATRIAL FIBRILLATION ABLATION;  Surgeon: Lanier Prude, MD;  Location: [facility];  Service: Cardiovascular;  Laterality: N/A;   ESOPHAGEAL DILATION     right knee arthroscopy     Dr. Rennis Chris for mensicus injury   UPPER GASTROINTESTINAL ENDOSCOPY  2011   Patient Active Problem List   Diagnosis Date Noted   Paroxysmal atrial fibrillation (HCC) 05/24/2022   GERD 06/04/2010   DYSPHAGIA 04/27/2010     REFERRING PROVIDER: Madelyn Brunner, DO  REFERRING DIAG: [phone] (ICD-10-CM) - Biceps tendinitis of left shoulder Left distal bicep tendinitis    THERAPY DIAG:  Pain in left elbow  Muscle weakness (generalized)  Rationale for Evaluation and Treatment: Rehabilitation  ONSET DATE: June 2024  SUBJECTIVE:                                                                                                                                                                                      SUBJECTIVE STATEMENT:  Pt reports he has still been taking Meloxicam. Some  soreness/tightness in forearm at entry, but states he is progressing overall.   Eval: Pt states he has had R elbow pain for 4-5 years.  Pt reports his L elbow pain began in June when he was increasing his resistance with gym exercises.   Pt saw Dr. Steward Drone in June and was dx'd with R lateral epicondylitis and L elbow distal biceps tendinitis.  MD note also indicated pt having R CMC arthritis.  Pt received a R elbow and thumb injection.  Pt states the elbow injection was wonderful having great relief.  His thumb injection only lasted 3-4 weeks.  Pt was referred to Dr. Shon Baton for shockwave therapy vs PRP injections.  Pt received 2 sessions of extracorporeal shockwave therapy and reports significant relief.  He states pressing on the distal biceps doesn't hurt now, though was hurting prior to just to touch it.  MD prescribed prednisone and meloxciam.  Pt just started prednisone today and will start meloxicam when the prednisone finished.    Pt reports he is having more pain down the brachioradialis muscle.  He has pain with elbow mobility.  Pt works out 3 days per week.  He limits his lifting including avoiding biceps curls due to L biceps pain. Pt is limited with yard work and uses a brace. Pt has pain with using L UE with household chores.   Hand dominance: Left  PERTINENT HISTORY: A-fib with ablation 2023, anxiety, arthritis, R knee scope R thumb pain/arthritis  PAIN:  NPRS:  0/10 current at rest, 8/10 worst pain Worst pain with lifting weights Location:  distal lateral biceps, brachioradialis  PRECAUTIONS: Other: A-fib   WEIGHT BEARING RESTRICTIONS: No  FALLS:  Has patient fallen in last 6 months? No   OCCUPATION: Firefighter  PLOF: Independent  PATIENT GOALS:to be able to use L elbow as before he injured it   OBJECTIVE:   DIAGNOSTIC FINDINGS:  X rays of elbow in June: IMPRESSION: Normal right elbow radiographs.  IMPRESSION: Normal left elbow radiographs.  PATIENT  SURVEYS:  FOTO 56 with a goal of 68 at visit 10  COGNITION: Overall cognitive status: Within functional limits for tasks assessed      UPPER EXTREMITY ROM:   AROM Right eval Left eval  Shoulder flexion    Shoulder extension    Shoulder abduction    Shoulder adduction    Shoulder internal rotation    Shoulder external rotation    Elbow flexion 150 150  Elbow extension 0 AROM/PROM:  2/0  Wrist flexion    Wrist extension    Wrist ulnar deviation    Wrist radial deviation    Wrist pronation  WFL  Wrist supination  WFL  (Blank rows = not tested)  UPPER EXTREMITY MMT:  MMT Right eval Left eval L 9/26  Shoulder flexion     Shoulder extension     Shoulder abduction     Shoulder adduction     Shoulder internal rotation     Shoulder external rotation     Middle trapezius     Lower trapezius     Elbow flexion 5/5 5/5   Elbow extension 5/5 4/5 with pain   Wrist flexion   5/5  Wrist extension   5/5  Brachioradialis  5/5 Tolerated min resistance with 7-8/10 pain   Wrist radial deviation     Wrist pronation  5/5   Wrist supination  5/5 with pain   Grip strength (lbs)     (Blank rows = not tested)   Grip strength: L:116lbs force 9/26    R: 115lbs force 9/26   PALPATION:  TTP:  none in biceps or biceps tendon.  Pt is tender in L brachioradialis    TODAY'S TREATMENT:  STM and IASTM using edge tool to both flexor and extensor forearm compartments/brachialis.   Wrist flexor stretch 20 seconds x 3 Wrist extensor stretch 20 seconds x3 Quadruped forearm stretching (bothered CMC) Wrist flexion/extension 6lb dumbbell 3x10ea Elbow flexion 3way curls  6lbs x20ea Pronation/supination (used roller with 2.5# cuf weight on one end) 2x10ea Partial push up at plinth x10 Triceps extension at cable column individually- 10lbs 2x10ea  PATIENT  EDUCATION: Education details:  HEP, POC, dx, prognosis, ice usage, and rationale of interventions including eccentric exercises.  PT used pictures on the internet to educate pt concerning relevant anatomy.  Person educated: Patient Education method: Explanation, Demonstration, Verbal cues, and Handouts Education comprehension: verbalized understanding, returned demonstration, and verbal cues required  HOME EXERCISE PROGRAM: Access Code: [email or web address] URL: [email or web address] Date: 08/29/2023 Prepared by: Aaron Edelman  Exercises - Standing Single Arm Elbow Flexion with Resistance  - 1 x daily - 5 x weekly - 2-3 sets - 10 reps  ASSESSMENT:  CLINICAL IMPRESSION: Pt has met 2 STG. Continued with STM/IASTM to address tightness in forearm and brachialis. Remains tender here. Able to progress with forearm and arm strengthening today without c/o pain. He was able to complete hammer curl with 6lb dumbbell without pain, which he states previously would have bothered him. Trialed WB with modified push ups without c/o pain. Pt fatigues most with supination/pronation strengthening. Will monitor for soreness/discomfort next session and continue to progress as able.   OBJECTIVE IMPAIRMENTS: decreased activity tolerance, decreased strength, increased fascial restrictions, impaired UE functional use, and pain.   ACTIVITY LIMITATIONS: lifting  PARTICIPATION LIMITATIONS: cleaning and yard work  PERSONAL FACTORS: 1-2 comorbidities: A-fib and arthritis  are also affecting patient's functional outcome.   REHAB POTENTIAL: Good  CLINICAL DECISION MAKING: Stable/uncomplicated  EVALUATION COMPLEXITY: Low   GOALS:  SHORT TERM GOALS: Target date:  09/19/2023   Pt will report at least a 25% reduction in pain with functional usage of L UE. Baseline: Goal status: MET 10/4  2.  Pt will report reduced tenderness to palpation of brachioradialis for improved inflammation and mobility.  Baseline:   Goal status: IN PROGRESS Target date:  09/26/2023  3.  Pt will be independent and compliant with HEP for improved pain, strength, and function.  Baseline:  Goal status: MET 10/4 Target date:  09/26/2023   LONG TERM GOALS: Target date: 10/24/2023  Pt will be able to perform his household chores without significant pain and limitation.  Baseline:  Goal status: INITIAL  2.  Pt will report he is able to perform his yard work without significant pain.   Baseline:  Goal status: INITIAL  3.  Pt will demo 5/5 brachioradialis strength with reduced pain with testing for improved strength with performance of IADLs.  Baseline:  Goal status: INITIAL    PLAN:  PT FREQUENCY: 1x/week  PT DURATION: other: 6-8 weeks  PLANNED INTERVENTIONS: Therapeutic exercises, Therapeutic activity, Neuromuscular re-education, Patient/Family education, Self Care, Joint mobilization, Aquatic Therapy, Dry Needling, Spinal mobilization, Cryotherapy, Moist heat, Taping, Ultrasound, Manual therapy, and Re-evaluation  PLAN FOR NEXT SESSION: possible IASTM to brachioradialis and distal biceps if tolerable.  Review HEP.  Eccentric strength training.    Riki Altes, PTA  09/19/23 10:05 AM

## 2023-09-23 ENCOUNTER — Ambulatory Visit (INDEPENDENT_AMBULATORY_CARE_PROVIDER_SITE_OTHER): Payer: BC Managed Care – PPO | Admitting: Sports Medicine

## 2023-09-23 DIAGNOSIS — M25522 Pain in left elbow: Secondary | ICD-10-CM

## 2023-09-23 DIAGNOSIS — M7918 Myalgia, other site: Secondary | ICD-10-CM | POA: Diagnosis not present

## 2023-09-23 DIAGNOSIS — M7522 Bicipital tendinitis, left shoulder: Secondary | ICD-10-CM | POA: Diagnosis not present

## 2023-09-23 DIAGNOSIS — G8929 Other chronic pain: Secondary | ICD-10-CM | POA: Diagnosis not present

## 2023-09-23 NOTE — Progress Notes (Signed)
Joseph Brennan - 62 y.o. male MRN [id number]  Date of birth: 01-22-1961  Office Visit Note: Visit Date: 09/23/2023 PCP: Joseph Ferretti, DO Referred by: Joseph Ferretti, DO  Subjective: Chief Complaint  Patient presents with   Left Elbow - Follow-up   HPI: Joseph Brennan "Joseph Brennan" is a pleasant 62 y.o. male who presents today for chronic left elbow pain.  We had performed 2 sessions of extracorporeal shockwave therapy for his distal biceps tendinitis and elbow pain and that essentially cleared up the bicep-related pain.  He is still dealing with some pain in the common extensor wad and brachioradialis musculature.  Overall his pain is certainly improved, he had good relief with a 6-day Medrol Dosepak and is continuing the meloxicam 15 mg which is helping.  He has a few tablets of the meloxicam left in his prescription.  He is doing both his formalized physical therapy as well as his home exercises and making improvements with pain reduction as well as strengthening.   Pertinent ROS were reviewed with the patient and found to be negative unless otherwise specified above in HPI.   Assessment & Plan: Visit Diagnoses:  1. Chronic pain of left elbow   2. Brachioradialis muscle tenderness   3. Biceps tendinitis of left shoulder    Plan: Joseph Brennan is making good progress with his chronic left elbow pain with concomitant brachioradialis/common extensor wad restriction as well as his distal biceps tendinitis.  He has had essentially complete resolution of the distal biceps tendinitis with extracorporeal shockwave treatment therapies in the past, he also had good improvement in general after the short course of prednisone 20 mg x 1 week.  He is performing both formalized physical therapy and doing home exercises.  He still has pain with certain motions and lifting, but in general is significantly improved.  He will continue the last few days of his meloxicam 15 mg and then will discontinue this altogether.  He  may use ice/heat or Tylenol following this as needed.  I do think with these treatment modalities he will continue to be near 100% after another 4-6 weeks.  If for some reason he is not improving, I did discuss we could consider a muscle/trigger point injection into the common flexor wad region, although hopefully he will not need this.  His exam today does not suggest irritation over the lateral epicondyle.  Continue PT, HEP will see me at the 4 to 6-week mark only if he is not improving.  Follow-up: Return for 4-6 weeks only as needed if not improving.   Meds & Orders: No orders of the defined types were placed in this encounter.  No orders of the defined types were placed in this encounter.    Procedures: No procedures performed      Clinical History: No specialty comments available.  He reports that he has never smoked. He has never used smokeless tobacco. No results for input(s): "HGBA1C", "LABURIC" in the last 8760 hours.  Objective:    Physical Exam  Gen: Well-appearing, in no acute distress; non-toxic CV:  Well-perfused. Warm.  Resp: Breathing unlabored on room air; no wheezing. Psych: Fluid speech in conversation; appropriate affect; normal thought process Neuro: Sensation intact throughout. No gross coordination deficits.   Ortho Exam - Left elbow/LUE: No redness swelling or effusion.  He has some tenderness with deep palpation over the common extensor wad.  No significant TTP over the lateral epicondyle.  No pain over the distal biceps insertion.  There is full range  of motion about the elbow in flexion and extension.  There is mild pain with resisted pronation/supination.  5/5 strength.  Imaging:  DG Hand Complete Right CLINICAL DATA:  Chronic right hand pain.  EXAM: RIGHT HAND - COMPLETE 3+ VIEW  COMPARISON:  Right fifth finger radiographs 06/03/2023  FINDINGS: 1 mm ulnar negative variance. Mild-to-moderate thumb carpometacarpal joint space narrowing, subchondral  sclerosis, and peripheral osteophytosis. Moderate third and fifth and mild-to-moderate second DIP joint space narrowing, subchondral sclerosis, and peripheral osteophytosis. No acute fracture or dislocation.  IMPRESSION: Mild-to-moderate thumb carpometacarpal and second through fifth DIP joint osteoarthritis.  Electronically Signed   By: Neita Garnet M.D.   On: 06/17/2023 19:14 DG Elbow 2 Views Left CLINICAL DATA:  Chronic bilateral elbow pain.  EXAM: LEFT ELBOW - 2 VIEW  COMPARISON:  None Available.  FINDINGS: Normal bone mineralization. Normal position of the distal anterior humeral fat pad without evidence of elbow joint effusion. Joint spaces are preserved. No acute fracture or dislocation.  IMPRESSION: Normal left elbow radiographs.  Electronically Signed   By: Neita Garnet M.D.   On: 06/17/2023 19:12 DG Elbow 2 Views Right CLINICAL DATA:  Chronic bilateral elbow pain.  EXAM: RIGHT ELBOW - 2 VIEW  COMPARISON:  None Available.  FINDINGS: Normal bone mineralization. Normal position of the distal anterior humeral fat pad without evidence of elbow joint effusion. Joint spaces are preserved. No acute fracture or dislocation.  IMPRESSION: Normal right elbow radiographs.  Electronically Signed   By: Neita Garnet M.D.   On: 06/17/2023 19:11    Past Medical/Family/Surgical/Social History: Medications & Allergies reviewed per EMR, new medications updated. Patient Active Problem List   Diagnosis Date Noted   Paroxysmal atrial fibrillation (HCC) 05/24/2022   GERD 06/04/2010   DYSPHAGIA 04/27/2010   Past Medical History:  Diagnosis Date   A-fib [facility])    Anxiety    Arthritis    Chest pain    Diverticulitis    August 2021   Diverticulosis    Fatigue    GERD (gastroesophageal reflux disease)    Hand arthritis    Hematuria    Insomnia    Lightheadedness    Family History  Problem Relation Age of Onset   Breast cancer Mother    Heart disease  Father    Diabetes Father    Prostate cancer Father    Skin cancer Father    Healthy Sister    Healthy Brother    Heart disease Maternal Grandfather    Heart disease Paternal Grandfather    Healthy Brother    Colon cancer Neg Hx    Pancreatic cancer Neg Hx    Liver disease Neg Hx    Esophageal cancer Neg Hx    Stomach cancer Neg Hx    Colon polyps Neg Hx    Rectal cancer Neg Hx    Past Surgical History:  Procedure Laterality Date   ADENOIDECTOMY     ATRIAL FIBRILLATION ABLATION N/A 04/26/2022   Procedure: ATRIAL FIBRILLATION ABLATION;  Surgeon: Lanier Prude, MD;  Location: [facility];  Service: Cardiovascular;  Laterality: N/A;   ESOPHAGEAL DILATION     right knee arthroscopy     Dr. Rennis Chris for mensicus injury   UPPER GASTROINTESTINAL ENDOSCOPY  2011   Social History   Occupational History   Occupation: Firefighter  Tobacco Use   Smoking status: Never   Smokeless tobacco: Never  Vaping Use   Vaping status: Never Used  Substance and Sexual Activity  Alcohol use: Not Currently   Drug use: No   Sexual activity: Yes    Partners: Female    Birth control/protection: None    Comment: married

## 2023-09-23 NOTE — Progress Notes (Signed)
Patient says that he is feeling much better and feels that he is moving in the right direction. He says that the Prednisone seemed to help get him started and the Meloxicam and physical therapy have been helping since then. He says that he is doing home exercises as instructed in physical therapy and those are going well.

## 2023-09-26 ENCOUNTER — Ambulatory Visit (HOSPITAL_BASED_OUTPATIENT_CLINIC_OR_DEPARTMENT_OTHER): Payer: BC Managed Care – PPO | Admitting: Physical Therapy

## 2023-09-26 ENCOUNTER — Encounter (HOSPITAL_BASED_OUTPATIENT_CLINIC_OR_DEPARTMENT_OTHER): Payer: Self-pay | Admitting: Physical Therapy

## 2023-09-26 DIAGNOSIS — M25522 Pain in left elbow: Secondary | ICD-10-CM

## 2023-09-26 DIAGNOSIS — M6281 Muscle weakness (generalized): Secondary | ICD-10-CM | POA: Diagnosis not present

## 2023-09-26 NOTE — Therapy (Signed)
OUTPATIENT PHYSICAL THERAPY UE TREATMENT   Patient Name: Joseph Brennan MRN: [id number] DOB:June 15, 1961, 62 y.o., male Today's Date: 09/26/2023  END OF SESSION:  PT End of Session - 09/26/23 0940     Visit Number 5    Number of Visits 6    Date for PT Re-Evaluation 10/24/23    Authorization Type BCBS    PT Start Time 0849    PT Stop Time 0924    PT Time Calculation (min) 35 min    Activity Tolerance Patient tolerated treatment well    Behavior During Therapy [facility] for tasks assessed/performed                Past Medical History:  Diagnosis Date   A-fib [facility])    Anxiety    Arthritis    Chest pain    Diverticulitis    August 2021   Diverticulosis    Fatigue    GERD (gastroesophageal reflux disease)    Hand arthritis    Hematuria    Insomnia    Lightheadedness    Past Surgical History:  Procedure Laterality Date   ADENOIDECTOMY     ATRIAL FIBRILLATION ABLATION N/A 04/26/2022   Procedure: ATRIAL FIBRILLATION ABLATION;  Surgeon: Lanier Prude, MD;  Location: [facility];  Service: Cardiovascular;  Laterality: N/A;   ESOPHAGEAL DILATION     right knee arthroscopy     Dr. Rennis Chris for mensicus injury   UPPER GASTROINTESTINAL ENDOSCOPY  2011   Patient Active Problem List   Diagnosis Date Noted   Paroxysmal atrial fibrillation (HCC) 05/24/2022   GERD 06/04/2010   DYSPHAGIA 04/27/2010     REFERRING PROVIDER: Madelyn Brunner, DO  REFERRING DIAG: [phone] (ICD-10-CM) - Biceps tendinitis of left shoulder Left distal bicep tendinitis    THERAPY DIAG:  Pain in left elbow  Muscle weakness (generalized)  Rationale for Evaluation and Treatment: Rehabilitation  ONSET DATE: June 2024  SUBJECTIVE:                                                                                                                                                                                      SUBJECTIVE STATEMENT:  Pt states he is making progress.  He reports he has made a  lot of progress in the past month.  He states the exercises are helping.  Pt reports compliance with HEP.  Pt saw Dr. Shon Baton earlier this week who spoke with him about a possible injection if needed in a month if he continues to have pain.  Pt tried 15 # curls and hammer curls and had pain.     Hand dominance: Left  PERTINENT HISTORY: A-fib with  ablation 2023, anxiety, arthritis, R knee scope R thumb pain/arthritis  PAIN:  NPRS:  0/10 current at rest, 8/10 worst pain Worst pain with lifting weights Location:  distal lateral biceps, brachioradialis  PRECAUTIONS: Other: A-fib   WEIGHT BEARING RESTRICTIONS: No  FALLS:  Has patient fallen in last 6 months? No   OCCUPATION: Firefighter  PLOF: Independent  PATIENT GOALS:to be able to use L elbow as before he injured it   OBJECTIVE:   DIAGNOSTIC FINDINGS:  X rays of elbow in June: IMPRESSION: Normal right elbow radiographs.  IMPRESSION: Normal left elbow radiographs.   TODAY'S TREATMENT:                                                                                                                                          IASTM using hawkgrips to distal biceps, brachioradialis, and forearm extensors.  (to improve soft tissue mobility and tightness, reduce myofascial restrictions and adhesions, and to promote proper cross fiber alignment)  Wrist flexor stretch 20 seconds x 3 Wrist extensor stretch 20 seconds x3 Wrist flexion/extension 6lb dumbbell 3x10ea Elbow flexion with eccentric focus in supination 6# x 10, 7# 2x10, hammer curls with 6lbs 2x10   PATIENT EDUCATION: Education details:  PT informed pt that 15# is too heavy at this time for his curls and instructed him to decrease the weight.  PT educated pt that he should not have increased pain or irritation with biceps curls.  PT instructed pt in appropriate resistance.  HEP, POC, dx, prognosis, and rationale of interventions including eccentric exercises.    Person educated: Patient Education method:  Explanation, Demonstration, Verbal cues  Education comprehension: verbalized understanding, returned demonstration, and verbal cues required  HOME EXERCISE PROGRAM: Access Code: [email or web address] URL: [email or web address] Date: 08/29/2023 Prepared by: Aaron Edelman  Exercises - Standing Single Arm Elbow Flexion with Resistance  - 1 x daily - 5 x weekly - 2-3 sets - 10 reps  ASSESSMENT:  CLINICAL IMPRESSION: Pt is progressing well with pain and sx's.  Pt had an appropriate response to IASTM.  He is improving with tolerance to strengthening including eccentric strengthening as evidenced by performance of exercises.  PT provided education concerning appropriate resistance and appropriate response to exercises and pt demonstrates good understanding.  He tolerated exercises well without increased pain.  Pt responded well to Rx stating he feels good having no c/o's after Rx.   OBJECTIVE IMPAIRMENTS: decreased activity tolerance, decreased strength, increased fascial restrictions, impaired UE functional use, and pain.   ACTIVITY LIMITATIONS: lifting  PARTICIPATION LIMITATIONS: cleaning and yard work  PERSONAL FACTORS: 1-2 comorbidities: A-fib and arthritis  are also affecting patient's functional outcome.   REHAB POTENTIAL: Good  CLINICAL DECISION MAKING: Stable/uncomplicated  EVALUATION COMPLEXITY: Low   GOALS:  SHORT TERM GOALS: Target date:  09/19/2023   Pt will report at least a 25% reduction in pain with functional usage of L  UE. Baseline: Goal status: MET 10/4  2.  Pt will report reduced tenderness to palpation of brachioradialis for improved inflammation and mobility.  Baseline:  Goal status: IN PROGRESS Target date:  09/26/2023  3.  Pt will be independent and compliant with HEP for improved pain, strength, and function.  Baseline:  Goal status: MET 10/4 Target date:  09/26/2023   LONG TERM GOALS: Target date:  10/24/2023  Pt will be able to perform his household chores without significant pain and limitation.  Baseline:  Goal status: INITIAL  2.  Pt will report he is able to perform his yard work without significant pain.   Baseline:  Goal status: INITIAL  3.  Pt will demo 5/5 brachioradialis strength with reduced pain with testing for improved strength with performance of IADLs.  Baseline:  Goal status: INITIAL    PLAN:  PT FREQUENCY: 1x/week  PT DURATION: other: 6-8 weeks  PLANNED INTERVENTIONS: Therapeutic exercises, Therapeutic activity, Neuromuscular re-education, Patient/Family education, Self Care, Joint mobilization, Aquatic Therapy, Dry Needling, Spinal mobilization, Cryotherapy, Moist heat, Taping, Ultrasound, Manual therapy, and Re-evaluation  PLAN FOR NEXT SESSION: Cont with IASTM to brachioradialis and distal biceps.  Eccentric strength training.    Audie Clear III PT, DPT 09/26/23 9:41 PM

## 2023-10-03 ENCOUNTER — Encounter (HOSPITAL_BASED_OUTPATIENT_CLINIC_OR_DEPARTMENT_OTHER): Payer: Self-pay

## 2023-10-03 ENCOUNTER — Ambulatory Visit (HOSPITAL_BASED_OUTPATIENT_CLINIC_OR_DEPARTMENT_OTHER): Payer: BC Managed Care – PPO

## 2023-10-03 DIAGNOSIS — M25522 Pain in left elbow: Secondary | ICD-10-CM

## 2023-10-03 DIAGNOSIS — M6281 Muscle weakness (generalized): Secondary | ICD-10-CM

## 2023-10-03 NOTE — Therapy (Signed)
OUTPATIENT PHYSICAL THERAPY UE TREATMENT   Patient Name: Joseph Brennan MRN: [id number] DOB:[birth year], 62 y.o., male Today's Date: 10/03/2023  END OF SESSION:  PT End of Session - 10/03/23 0930     Visit Number 6    Number of Visits 6    Date for PT Re-Evaluation 10/24/23    Authorization Type BCBS    PT Start Time 0845    PT Stop Time 0930    PT Time Calculation (min) 45 min    Activity Tolerance Patient tolerated treatment well    Behavior During Therapy [facility] for tasks assessed/performed                 Past Medical History:  Diagnosis Date   A-fib [facility])    Anxiety    Arthritis    Chest pain    Diverticulitis    August 2021   Diverticulosis    Fatigue    GERD (gastroesophageal reflux disease)    Hand arthritis    Hematuria    Insomnia    Lightheadedness    Past Surgical History:  Procedure Laterality Date   ADENOIDECTOMY     ATRIAL FIBRILLATION ABLATION N/A 04/26/2022   Procedure: ATRIAL FIBRILLATION ABLATION;  Surgeon: Lanier Prude, MD;  Location: [facility] IN[facility];  Service: Cardiovascular;  Laterality: N/A;   ESOPHAGEAL DILATION     right knee arthroscopy     Dr. Rennis Chris for mensicus injury   UPPER GASTROINTESTINAL ENDOSCOPY  2011   Patient Active Problem List   Diagnosis Date Noted   Paroxysmal atrial fibrillation (HCC) 05/24/2022   GERD 06/04/2010   DYSPHAGIA 04/27/2010     REFERRING PROVIDER: Madelyn Brunner, DO  REFERRING DIAG: [phone] (ICD-10-CM) - Biceps tendinitis of left shoulder Left distal bicep tendinitis    THERAPY DIAG:  Pain in left elbow  Muscle weakness (generalized)  Rationale for Evaluation and Treatment: Rehabilitation  ONSET DATE: June 2024  SUBJECTIVE:                                                                                                                                                                                      SUBJECTIVE STATEMENT:  "It' snot better,but no worse." Pain with pronation  exercise using mallet at home. Has been using 8lb dumbbells at home for hammer and bicep curls with no problem.    Hand dominance: Left  PERTINENT HISTORY: A-fib with ablation 2023, anxiety, arthritis, R knee scope R thumb pain/arthritis  PAIN:  NPRS:  0/10 current at rest, 8/10 worst pain Worst pain with lifting weights Location:  distal lateral biceps, brachioradialis  PRECAUTIONS: Other: A-fib   WEIGHT BEARING RESTRICTIONS: No  FALLS:  Has patient fallen in last 6 months? No   OCCUPATION: Firefighter  PLOF: Independent  PATIENT GOALS:to be able to use L elbow as before he injured it   OBJECTIVE:   DIAGNOSTIC FINDINGS:  X rays of elbow in June: IMPRESSION: Normal right elbow radiographs.  IMPRESSION: Normal left elbow radiographs.   TODAY'S TREATMENT:                                                                                                                                          STM and IASTM to wrist flexor/extensors  Wrist flexor stretch 30 seconds x 2 Wrist extensor stretch 30 seconds x2 Wrist flexion/extension 6lb dumbbell 3x10ea Elbow flexion with eccentric focus in supination 6# 3x 10, 6# 2x10, hammer curls with 6lbs 3x10 Forearm pronation/supination with GTB 2x10ea     PATIENT EDUCATION: Education details:  PT informed pt that 15# is too heavy at this time for his curls and instructed him to decrease the weight.  PT educated pt that he should not have increased pain or irritation with biceps curls.  PT instructed pt in appropriate resistance.  HEP, POC, dx, prognosis, and rationale of interventions including eccentric exercises.   Person educated: Patient Education method:  Explanation, Demonstration, Verbal cues  Education comprehension: verbalized understanding, returned demonstration, and verbal cues required  HOME EXERCISE PROGRAM: Access Code: [email or web address] URL: [email or web address] Date: 08/29/2023 Prepared by: Aaron Edelman  Exercises - Standing Single Arm Elbow Flexion with Resistance  - 1 x daily - 5 x weekly - 2-3 sets - 10 reps  ASSESSMENT:  CLINICAL IMPRESSION: Remains tender and tight in brachioradialis and pronator teres region. Continued with STM and IASTM to this area. He reported improved tolerance for theraband resisted pronation/supination vs mallet. Instructed pt to perform it with band at home. Discussed PT goals and expectations with pt. Pt may require cortisone injection in the future if symptoms do not improve.   OBJECTIVE IMPAIRMENTS: decreased activity tolerance, decreased strength, increased fascial restrictions, impaired UE functional use, and pain.   ACTIVITY LIMITATIONS: lifting  PARTICIPATION LIMITATIONS: cleaning and yard work  PERSONAL FACTORS: 1-2 comorbidities: A-fib and arthritis  are also affecting patient's functional outcome.   REHAB POTENTIAL: Good  CLINICAL DECISION MAKING: Stable/uncomplicated  EVALUATION COMPLEXITY: Low   GOALS:  SHORT TERM GOALS: Target date:  09/19/2023   Pt will report at least a 25% reduction in pain with functional usage of L UE. Baseline: Goal status: MET 10/4  2.  Pt will report reduced tenderness to palpation of brachioradialis for improved inflammation and mobility.  Baseline:  Goal status: IN PROGRESS Target date:  09/26/2023  3.  Pt will be independent and compliant with HEP for improved pain, strength, and function.  Baseline:  Goal status: MET 10/4 Target date:  09/26/2023   LONG TERM GOALS: Target date: 10/24/2023  Pt will be able to perform  his household chores without significant pain and limitation.  Baseline:  Goal status: INITIAL  2.  Pt will report he is able to perform his yard work without significant pain.   Baseline:  Goal status: INITIAL  3.  Pt will demo 5/5 brachioradialis strength with reduced pain with testing for improved strength with performance of IADLs.  Baseline:  Goal status:  INITIAL    PLAN:  PT FREQUENCY: 1x/week  PT DURATION: other: 6-8 weeks  PLANNED INTERVENTIONS: Therapeutic exercises, Therapeutic activity, Neuromuscular re-education, Patient/Family education, Self Care, Joint mobilization, Aquatic Therapy, Dry Needling, Spinal mobilization, Cryotherapy, Moist heat, Taping, Ultrasound, Manual therapy, and Re-evaluation  PLAN FOR NEXT SESSION: Cont with IASTM to brachioradialis and distal biceps.  Eccentric strength training.    Riki Altes, PTA  10/03/23 10:12 AM

## 2023-10-10 ENCOUNTER — Encounter (HOSPITAL_BASED_OUTPATIENT_CLINIC_OR_DEPARTMENT_OTHER): Payer: Self-pay | Admitting: Physical Therapy

## 2023-10-10 ENCOUNTER — Ambulatory Visit (HOSPITAL_BASED_OUTPATIENT_CLINIC_OR_DEPARTMENT_OTHER): Payer: BC Managed Care – PPO | Admitting: Physical Therapy

## 2023-10-10 DIAGNOSIS — M6281 Muscle weakness (generalized): Secondary | ICD-10-CM

## 2023-10-10 DIAGNOSIS — M25522 Pain in left elbow: Secondary | ICD-10-CM

## 2023-10-10 NOTE — Therapy (Signed)
OUTPATIENT PHYSICAL THERAPY UE TREATMENT   Patient Name: Joseph Brennan MRN: [id number] DOB:[birth year], 62 y.o., male Today's Date: 10/11/2023  END OF SESSION:  PT End of Session - 10/10/23 0859     Visit Number 7    Number of Visits 8    Date for PT Re-Evaluation 10/24/23    Authorization Type BCBS    PT Start Time [phone]    PT Stop Time 0936    PT Time Calculation (min) 45 min    Activity Tolerance Patient tolerated treatment well    Behavior During Therapy [facility] for tasks assessed/performed                  Past Medical History:  Diagnosis Date   A-fib [facility])    Anxiety    Arthritis    Chest pain    Diverticulitis    August 2021   Diverticulosis    Fatigue    GERD (gastroesophageal reflux disease)    Hand arthritis    Hematuria    Insomnia    Lightheadedness    Past Surgical History:  Procedure Laterality Date   ADENOIDECTOMY     ATRIAL FIBRILLATION ABLATION N/A 04/26/2022   Procedure: ATRIAL FIBRILLATION ABLATION;  Surgeon: Lanier Prude, MD;  Location: [facility];  Service: Cardiovascular;  Laterality: N/A;   ESOPHAGEAL DILATION     right knee arthroscopy     Dr. Rennis Chris for mensicus injury   UPPER GASTROINTESTINAL ENDOSCOPY  2011   Patient Active Problem List   Diagnosis Date Noted   Paroxysmal atrial fibrillation (HCC) 05/24/2022   GERD 06/04/2010   DYSPHAGIA 04/27/2010     REFERRING PROVIDER: Madelyn Brunner, DO  REFERRING DIAG: [phone] (ICD-10-CM) - Biceps tendinitis of left shoulder Left distal bicep tendinitis    THERAPY DIAG:  Pain in left elbow  Muscle weakness (generalized)  Rationale for Evaluation and Treatment: Rehabilitation  ONSET DATE: June 2024  SUBJECTIVE:                                                                                                                                                                                      SUBJECTIVE STATEMENT:  "I can see the progress."  Pt states it's still weak  with applying pressure to it such as the gym exercises.  Pt is performing light weights with curls in the gym.  Pt denies any adverse effects after prior Rx.  Pt denies pain currently.    Pt states he has had some issues with A-fib this week.  He took his meds this AM which took care of it.      Hand dominance: Left  PERTINENT  HISTORY: A-fib with ablation 2023, anxiety, arthritis, R knee scope R thumb pain/arthritis  PAIN:  NPRS:  0/10 current at rest, 8/10 worst pain Worst pain with lifting weights Location:  distal lateral biceps, brachioradialis  PRECAUTIONS: Other: A-fib   WEIGHT BEARING RESTRICTIONS: No  FALLS:  Has patient fallen in last 6 months? No   OCCUPATION: Firefighter  PLOF: Independent  PATIENT GOALS:to be able to use L elbow as before he injured it   OBJECTIVE:   DIAGNOSTIC FINDINGS:  X rays of elbow in June: IMPRESSION: Normal right elbow radiographs.  IMPRESSION: Normal left elbow radiographs.   TODAY'S TREATMENT:                                                                                                                                          HR:  58  IASTM using hawkgrips to distal biceps, brachioradialis, and forearm extensors.  (to improve soft tissue mobility and tightness, reduce myofascial restrictions and adhesions, and to promote proper cross fiber alignment)   UBE x 3 mins lvl 1  Wrist flexor stretch 30 seconds x 2 Wrist extensor stretch 30 seconds x2 Wrist flexion/extension 6lb dumbbell 3x10ea Elbow flexion with eccentric focus in supination 6# x10, 7# 2x10, hammer curls with 6lbs x10, 7# x10 Forearm supination with eccentric focus with GTB 2x10   Wrist flexor stretch x 30 seconds Wrist extensor stretch x 30 seconds      PATIENT EDUCATION: Education details:  PT answered questions including self IASTM at home.  PT educated pt that he should not have increased pain or irritation with biceps curls.  PT instructed  pt in appropriate resistance.  HEP, POC, dx, prognosis, and rationale of interventions including eccentric exercises.   Person educated: Patient Education method:  Explanation, Demonstration, Verbal cues  Education comprehension: verbalized understanding, returned demonstration, and verbal cues required  HOME EXERCISE PROGRAM: Access Code: [email or web address] URL: [email or web address] Date: 08/29/2023 Prepared by: Aaron Edelman  Exercises - Standing Single Arm Elbow Flexion with Resistance  - 1 x daily - 5 x weekly - 2-3 sets - 10 reps  ASSESSMENT:  CLINICAL IMPRESSION: Pt reports he is improving.  Pt continues to have tightness in brachioradialis.  PT performed IASTM and pt had an appropriate response with improved soft tissue mobility and myofascial restriction.  Pt performed exercises well without c/o's.  He responded well to Rx stating his arm feels really good after Rx and has no pain.    OBJECTIVE IMPAIRMENTS: decreased activity tolerance, decreased strength, increased fascial restrictions, impaired UE functional use, and pain.   ACTIVITY LIMITATIONS: lifting  PARTICIPATION LIMITATIONS: cleaning and yard work  PERSONAL FACTORS: 1-2 comorbidities: A-fib and arthritis  are also affecting patient's functional outcome.   REHAB POTENTIAL: Good  CLINICAL DECISION MAKING: Stable/uncomplicated  EVALUATION COMPLEXITY: Low   GOALS:  SHORT TERM GOALS: Target date:  09/19/2023   Pt will report  at least a 25% reduction in pain with functional usage of L UE. Baseline: Goal status: MET 10/4  2.  Pt will report reduced tenderness to palpation of brachioradialis for improved inflammation and mobility.  Baseline:  Goal status: IN PROGRESS Target date:  09/26/2023  3.  Pt will be independent and compliant with HEP for improved pain, strength, and function.  Baseline:  Goal status: MET 10/4 Target date:  09/26/2023   LONG TERM GOALS: Target date: 10/24/2023  Pt will be  able to perform his household chores without significant pain and limitation.  Baseline:  Goal status: INITIAL  2.  Pt will report he is able to perform his yard work without significant pain.   Baseline:  Goal status: INITIAL  3.  Pt will demo 5/5 brachioradialis strength with reduced pain with testing for improved strength with performance of IADLs.  Baseline:  Goal status: INITIAL    PLAN:  PT FREQUENCY: 1x/week  PT DURATION: other: 6-8 weeks  PLANNED INTERVENTIONS: Therapeutic exercises, Therapeutic activity, Neuromuscular re-education, Patient/Family education, Self Care, Joint mobilization, Aquatic Therapy, Dry Needling, Spinal mobilization, Cryotherapy, Moist heat, Taping, Ultrasound, Manual therapy, and Re-evaluation  PLAN FOR NEXT SESSION: Cont with IASTM to brachioradialis and distal biceps.  Eccentric strength training.    Audie Clear III PT, DPT 10/11/23 8:09 AM

## 2023-10-24 ENCOUNTER — Ambulatory Visit (HOSPITAL_BASED_OUTPATIENT_CLINIC_OR_DEPARTMENT_OTHER): Payer: BC Managed Care – PPO | Admitting: Physical Therapy

## 2023-10-31 ENCOUNTER — Ambulatory Visit (HOSPITAL_BASED_OUTPATIENT_CLINIC_OR_DEPARTMENT_OTHER): Payer: BC Managed Care – PPO | Attending: Sports Medicine | Admitting: Physical Therapy

## 2023-10-31 ENCOUNTER — Encounter (HOSPITAL_BASED_OUTPATIENT_CLINIC_OR_DEPARTMENT_OTHER): Payer: Self-pay | Admitting: Physical Therapy

## 2023-10-31 DIAGNOSIS — M25522 Pain in left elbow: Secondary | ICD-10-CM | POA: Insufficient documentation

## 2023-10-31 DIAGNOSIS — M6281 Muscle weakness (generalized): Secondary | ICD-10-CM | POA: Insufficient documentation

## 2023-10-31 NOTE — Therapy (Addendum)
 OUTPATIENT PHYSICAL THERAPY UE TREATMENT PHYSICAL THERAPY DISCHARGE SUMMARY  Visits from Start of Care: 8  Current functional level related to goals / functional outcomes: See PN   Remaining deficits: See PN   Education / Equipment: Management of condition/HEP   Patient agrees to discharge. Patient goals were partially met. Patient is being discharged due to not returning since the last visit.  Addend Corrie Dandy Tomma Lightning) Ziemba MPT 02/16/24 10:11 AM [facility] [address] [place], [place], [address] Phone: [phone]   Fax:  [phone]     Patient Name: Odean Fester MRN: [id number] DOB:Jul 31, 1961, 62 y.o., male Today's Date: 10/31/2023 Progress Note   Reporting Period 08/29/23 to 10/31/23   See note below for Objective Data and Assessment of Progress/Goals  END OF SESSION:  PT End of Session - 10/31/23 0848     Visit Number 8    Number of Visits 12    Date for PT Re-Evaluation 11/28/23    Authorization Type BCBS    PT Start Time 0848    PT Stop Time 0928    PT Time Calculation (min) 40 min    Activity Tolerance Patient tolerated treatment well    Behavior During Therapy [facility] for tasks assessed/performed                  Past Medical History:  Diagnosis Date   A-fib [facility])    Anxiety    Arthritis    Chest pain    Diverticulitis    August 2021   Diverticulosis    Fatigue    GERD (gastroesophageal reflux disease)    Hand arthritis    Hematuria    Insomnia    Lightheadedness    Past Surgical History:  Procedure Laterality Date   ADENOIDECTOMY     ATRIAL FIBRILLATION ABLATION N/A 04/26/2022   Procedure: ATRIAL FIBRILLATION ABLATION;  Surgeon: Lanier Prude, MD;  Location: [facility];  Service: Cardiovascular;  Laterality: N/A;   ESOPHAGEAL DILATION     right knee arthroscopy     Dr. Rennis Chris for mensicus injury   UPPER GASTROINTESTINAL ENDOSCOPY  2011   Patient Active Problem  List   Diagnosis Date Noted   Paroxysmal atrial fibrillation (HCC) 05/24/2022   GERD 06/04/2010   DYSPHAGIA 04/27/2010     REFERRING PROVIDER: Madelyn Brunner, DO  REFERRING DIAG: [phone] (ICD-10-CM) - Biceps tendinitis of left shoulder Left distal bicep tendinitis    THERAPY DIAG:  Pain in left elbow  Muscle weakness (generalized)  Rationale for Evaluation and Treatment: Rehabilitation  ONSET DATE: June 2024  SUBJECTIVE:  SUBJECTIVE STATEMENT: Patient states pretty good. Still can still feel it some with working out, ADL. Feels best it has been since July. Bicep curls and hammer curls still bother him. Patient states 90% improvement in symptoms. Overall things have been better with chores/yard work. Has been doing HEP and exercise. Feels he is ready to be done but wants the symptoms to go away if they can.     Hand dominance: Left  PERTINENT HISTORY: A-fib with ablation 2023, anxiety, arthritis, R knee scope R thumb pain/arthritis  PAIN:  NPRS:  0/10 current at rest, 8/10 worst pain Worst pain with lifting weights Location:  distal lateral biceps, brachioradialis  PRECAUTIONS: Other: A-fib   WEIGHT BEARING RESTRICTIONS: No  FALLS:  Has patient fallen in last 6 months? No   OCCUPATION: Firefighter  PLOF: Independent  PATIENT GOALS:to be able to use L elbow as before he injured it   OBJECTIVE:   DIAGNOSTIC FINDINGS:  X rays of elbow in June: IMPRESSION: Normal right elbow radiographs.  IMPRESSION: Normal left elbow radiographs.  PATIENT SURVEYS:  FOTO 56 with a goal of 68 at visit 10 10/31/23: 71 % function   COGNITION: Overall cognitive status: Within functional limits for tasks assessed                                    UPPER EXTREMITY ROM:    AROM Right eval  Left eval  Shoulder flexion      Shoulder extension      Shoulder abduction      Shoulder adduction      Shoulder internal rotation      Shoulder external rotation      Elbow flexion 150 150  Elbow extension 0 AROM/PROM:  2/0  Wrist flexion      Wrist extension      Wrist ulnar deviation      Wrist radial deviation      Wrist pronation   WFL  Wrist supination   WFL  (Blank rows = not tested)   UPPER EXTREMITY MMT:   MMT Right eval Left eval Right 10/31/23 Left  10/31/23  Shoulder flexion        Shoulder extension        Shoulder abduction        Shoulder adduction        Shoulder internal rotation        Shoulder external rotation        Middle trapezius        Lower trapezius        Elbow flexion 5/5 5/5 5 5   Elbow extension 5/5 4/5 with pain 5 5  Wrist flexion        Wrist extension        Brachioradialis  5/5 Tolerated min resistance with 7-8/10 pain 5 4+*  Wrist radial deviation        Wrist pronation   5/5  4+*  Wrist supination   5/5 with pain  5  Grip strength (lbs)        (Blank rows = not tested) * = symptoms       PALPATION:  TTP:  none in biceps or biceps tendon.  Pt is tender in L brachioradialis  10/31/23:TTP L brachioradialis  TODAY'S TREATMENT:  10/31/23 Reassessment Manual: STM to L brachioradialis pre and post dry needling for trigger point identification and muscular relaxation. Trigger Point Dry-Needling  Treatment instructions: Expect mild to moderate muscle soreness. S/S of pneumothorax if dry needled over a lung field, and to seek immediate medical attention should they occur. Patient verbalized understanding of these instructions and education.  Patient Consent Given: Yes Education handout provided: Yes Muscles treated: L brachioradialis  Electrical stimulation performed: No Parameters: N/A Treatment  response/outcome: twitch response, decrease in tissue tension Hammer curl 5# 2 x 10  10/10/23 HR:  58  IASTM using hawkgrips to distal biceps, brachioradialis, and forearm extensors.  (to improve soft tissue mobility and tightness, reduce myofascial restrictions and adhesions, and to promote proper cross fiber alignment)   UBE x 3 mins lvl 1  Wrist flexor stretch 30 seconds x 2 Wrist extensor stretch 30 seconds x2 Wrist flexion/extension 6lb dumbbell 3x10ea Elbow flexion with eccentric focus in supination 6# x10, 7# 2x10, hammer curls with 6lbs x10, 7# x10 Forearm supination with eccentric focus with GTB 2x10   Wrist flexor stretch x 30 seconds Wrist extensor stretch x 30 seconds      PATIENT EDUCATION: Education details:  PT answered questions including self IASTM at home.  PT educated pt that he should not have increased pain or irritation with biceps curls.  PT instructed pt in appropriate resistance.  HEP, POC, dx, prognosis, and rationale of interventions including eccentric exercises.  10/31/23 reassessment findings, HEP, DN Person educated: Patient Education method:  Explanation, Demonstration, Verbal cues  Education comprehension: verbalized understanding, returned demonstration, and verbal cues required  HOME EXERCISE PROGRAM: Access Code: [email or web address] URL: [email or web address] Date: 08/29/2023 Prepared by: Aaron Edelman  Exercises - Standing Single Arm Elbow Flexion with Resistance  - 1 x daily - 5 x weekly - 2-3 sets - 10 reps  ASSESSMENT:  CLINICAL IMPRESSION: Patient has met 2/3 short term goals and 2/3 long term goals with ability to complete HEP and improvement in symptoms, strength, activity tolerance,  and functional mobility. Remaining goals not met due to continued deficits in symptoms, strength. Patient has made good progress toward remaining goals. Trial of DN performed today with twitch response and decrease in tissue tension. Extending POC  1x/week for 4 weeks to assess response to DN and possibly continue. Patient will continue to benefit from skilled physical therapy in order to improve function and reduce impairment.    OBJECTIVE IMPAIRMENTS: decreased activity tolerance, decreased strength, increased fascial restrictions, impaired UE functional use, and pain.   ACTIVITY LIMITATIONS: lifting  PARTICIPATION LIMITATIONS: cleaning and yard work  PERSONAL FACTORS: 1-2 comorbidities: A-fib and arthritis  are also affecting patient's functional outcome.   REHAB POTENTIAL: Good  CLINICAL DECISION MAKING: Stable/uncomplicated  EVALUATION COMPLEXITY: Low   GOALS:  SHORT TERM GOALS: Target date:  09/19/2023   Pt will report at least a 25% reduction in pain with functional usage of L UE. Baseline: Goal status: MET 10/4  2.  Pt will report reduced tenderness to palpation of brachioradialis for improved inflammation and mobility.  Baseline:  Goal status: IN PROGRESS Target date:  09/26/2023  3.  Pt will be independent and compliant with HEP for improved pain, strength, and function.  Baseline:  Goal status: MET 10/4 Target date:  09/26/2023   LONG TERM GOALS: Target date: 10/24/2023  Pt will be able to perform his household chores without significant pain and limitation.  Baseline:  Goal status: MET  2.  Pt will report he is  able to perform his yard work without significant pain.   Baseline:  Goal status: MET  3.  Pt will demo 5/5 brachioradialis strength with reduced pain with testing for improved strength with performance of IADLs.  Baseline:  Goal status: INITIAL    PLAN:  PT FREQUENCY: 1x/week  PT DURATION: other: 6-8 weeks  PLANNED INTERVENTIONS: Therapeutic exercises, Therapeutic activity, Neuromuscular re-education, Patient/Family education, Self Care, Joint mobilization, Aquatic Therapy, Dry Needling, Spinal mobilization, Cryotherapy, Moist heat, Taping, Ultrasound, Manual therapy, and  Re-evaluation  PLAN FOR NEXT SESSION: Cont with IASTM to brachioradialis and distal biceps.  Eccentric strength training. Possibly continue DN    Wyman Songster, PT 10/31/2023, 9:26 AM

## 2023-10-31 NOTE — Patient Instructions (Signed)

## 2023-11-06 ENCOUNTER — Encounter: Payer: Self-pay | Admitting: *Deleted

## 2023-11-06 NOTE — Progress Notes (Signed)
Cardiology Clinic Note    Date:  11/07/2023  Patient ID:  Joseph Brennan, DOB [birth year], MRN [id number] PCP:  Charlane Ferretti, DO  Cardiologist:  None Electrophysiologist: Lanier Prude, MD     Patient Profile    Chief Complaint: afib follow-up  History of Present Illness: Joseph Brennan is a 62 y.o. male with PMH notable for parox afib, aortic insuff; seen today for Lanier Prude, MD for routine electrophysiology followup.  He is status post A-fib ablation 04/2022 with PVI and CVI.  He last saw Dr. Lalla Brothers 07/2022, during which time he endorsed significant improvement in his A-fib since ablation, but was still having short A-fib episodes.  He was using Apple Watch for monitoring of his heart rhythm.  The patient requested to stop blood thinner, Dr. Lalla Brothers recommended that he do to monitor his rhythm for additional 6 months and if no reoccurrence, okay to stop Eliquis and start low-dose aspirin.  On follow-up today, he is worried that he is having afib episodes, but they do not feel like his previous episodes. Previous to his ablation, he would feel his heart racing during during afib episodes. Now, he feels more of a fluttering, palpitations during episodes. He wears an apple watch religiously, that also confirms increased afib burden. His episodes are lasting a couple minutes a few times a week.  On review of apple watch episodes, appears SR with frequent PACs, but difficult to discern with artifact.  He exercises regularly 5 days a week, no change in exercise capacity.  He does not drink ETOH, no tobacco use, no s/s concerning for sleep apnea. No chest pain, chest pressure, SOB. No syncope, no change in appetite.   He continues to take 12.5mg  lopressor during Afib episodes.  He continues to take 81mg  ASA without bleeding concerns.  He is retiring at the end of December 2024    AAD History: none    ROS:  Please see the history of present illness. All other systems are  reviewed and otherwise negative.    Physical Exam    VS:  BP 120/70 (BP Location: Left Arm, Patient Position: Sitting, Cuff Size: Normal)   Pulse (!) 57   Ht 6\' 6"  (1.981 m)   Wt 193 lb 6 oz (87.7 kg)   SpO2 98%   BMI 22.35 kg/m  BMI: Body mass index is 22.35 kg/m.  Wt Readings from Last 3 Encounters:  11/07/23 193 lb 6 oz (87.7 kg)  07/31/22 195 lb 3.2 oz (88.5 kg)  05/24/22 200 lb 12.8 oz (91.1 kg)     GEN- The patient is well appearing, alert and oriented x 3 today.   Lungs- Clear to ausculation bilaterally, normal work of breathing.  Heart- Regular rate and rhythm, no murmurs, rubs or gallops Extremities- No peripheral edema, warm, dry    Studies Reviewed   Previous EP, cardiology notes.    EKG is ordered. Personal review of EKG from today shows:    EKG Interpretation Date/Time:  Friday November 07 2023 11:17:13 EST Ventricular Rate:  57 PR Interval:  156 QRS Duration:  104 QT Interval:  402 QTC Calculation: 391 R Axis:   26  Text Interpretation: Sinus bradycardia with sinus arrhythmia When compared with ECG of 24-May-2022 11:04, No significant change was found Confirmed by Sherie Don [id number]) on 11/07/2023 11:18:27 AM    TTE, 04/01/2023  1. Left ventricular ejection fraction, by estimation, is 55 to 60%. Left ventricular ejection fraction by 3D volume is  54 %. The left ventricle has normal function. The left ventricle has no regional wall motion abnormalities. Left ventricular diastolic  parameters were normal.   2. Right ventricular systolic function is normal. The right ventricular size is normal. Tricuspid regurgitation signal is inadequate for assessing PA pressure.   3. Borderline mitral valve prolapse of the posterior leaflet. The mitral valve is grossly normal. Mild mitral valve regurgitation. No evidence of mitral stenosis.   4. The aortic valve is tricuspid. There is mild calcification of the aortic valve. There is moderate thickening of the aortic  valve. Aortic valve regurgitation is mild to moderate. Aortic valve sclerosis/calcification is present, without any evidence of  aortic stenosis.   5. Ascending aorta measurements are within normal limits for age when indexed to body surface area.   6. The inferior vena cava is normal in size with greater than 50% respiratory variability, suggesting right atrial pressure of 3 mmHg.   Cardiac CT, 04/19/2022 1. There is normal pulmonary vein drainage into the left atrium with ostial measurements above.  2. There is no thrombus in the left atrial appendage.  3. The esophagus runs in the left atrial midline and is not in proximity to any of the pulmonary vein ostia.  4. Small PFO.  5. Normal coronary origin. Right dominance.  6. CAC score of 28 which is 55 percentile for age-, race-, and sex-matched controls.  TTE, 04/19/2022  1. Left ventricular ejection fraction, by estimation, is 65 to 70%. The left ventricle has normal function. The left ventricle has no regional wall motion abnormalities. Left ventricular diastolic parameters were normal. Notable trabeculation without evidence of LVNC.   2. Right ventricular systolic function is normal. The right ventricular size is normal. Tricuspid regurgitation signal is inadequate for assessing PA pressure.   3. Left atrial size was moderately dilated.   4. The mitral valve is abnormal- billowing without frank prolapse. Trivial mitral valve regurgitation. No evidence of mitral stenosis.   5. The aortic valve is tricuspid. There is mild calcification of the aortic valve, with thickening increase for age. There is mild thickening of the aortic valve. Aortic valve regurgitation is mild to moderate at  most. Pressure Half time 1047 ms. VC 3.3  mm. Aortic valve sclerosis is present, with no evidence of aortic valve stenosis.   6. Aortic dilatation noted. There is mild dilatation of the ascending aorta, measuring 40 mm.      Assessment and Plan     #) parox  afib #) palpitations #) bradycardia S/p ablation 04/2022 with no significant recurrence until a few months ago Apple watch recordings do not clearly show afib, but with significant artifact 2 week zio to further eval, encouraged to use symptom activator during episodes Query whether increased stress with retiring contributing to palpitations Unable to increase BB d/t bradycardia Continue 12.5mg  lopressor during afib episodes, recommend he check BP prior to taking If zio shows afib, he is agreeable to redo ablation, if recommended  #) Hypercoag d/t parox afib CHA2DS2-VASc Score = 1 [CHF History: 0, HTN History: 0, Diabetes History: 0, Stroke History: 0, Vascular Disease History: 1, Age Score: 0, Gender Score: 0].  Therefore, the patient's annual risk of stroke is 0.6 %.    Long discussion regarding OAC. Given that his palpitation episodes are lasting a couple minutes in duration with low chads-vasc score, do not feel strongly about restarting eliquis. Patient is agreeable with this. He is also agreeable to restarting eliquis if zio monitor reveals higher  afib burden Continue 81mg  ASA No bleeding concerns        Current medicines are reviewed at length with the patient today.   The patient has concerns regarding his medicines.  The following changes were made today:  none  Labs/ tests ordered today include:  Orders Placed This Encounter  Procedures   LONG TERM MONITOR (3-14 DAYS)   EKG 12-Lead     Disposition: Follow up with Dr. Lalla Brothers or EP APP  4-6 weeks after zio results   Patient prefers to be seen in [place] office.  Signed, Sherie Don, NP  11/07/23  12:08 PM  Electrophysiology [facility]

## 2023-11-07 ENCOUNTER — Ambulatory Visit (HOSPITAL_BASED_OUTPATIENT_CLINIC_OR_DEPARTMENT_OTHER): Payer: BC Managed Care – PPO | Admitting: Physical Therapy

## 2023-11-07 ENCOUNTER — Encounter: Payer: Self-pay | Admitting: Cardiology

## 2023-11-07 ENCOUNTER — Ambulatory Visit: Payer: BC Managed Care – PPO | Attending: Cardiology | Admitting: Cardiology

## 2023-11-07 VITALS — BP 120/70 | HR 57 | Ht 78.0 in | Wt 193.4 lb

## 2023-11-07 DIAGNOSIS — I480 Paroxysmal atrial fibrillation: Secondary | ICD-10-CM

## 2023-11-07 DIAGNOSIS — R001 Bradycardia, unspecified: Secondary | ICD-10-CM | POA: Diagnosis not present

## 2023-11-07 DIAGNOSIS — R002 Palpitations: Secondary | ICD-10-CM | POA: Diagnosis not present

## 2023-11-07 DIAGNOSIS — D6869 Other thrombophilia: Secondary | ICD-10-CM

## 2023-11-07 NOTE — Patient Instructions (Signed)
Medication Instructions:  No changes at this time  *If you need a refill on your cardiac medications before your next appointment, please call your pharmacy*   Lab Work: None  If you have labs (blood work) drawn today and your tests are completely normal, you will receive your results only by: MyChart Message (if you have MyChart) OR A paper copy in the mail If you have any lab test that is abnormal or we need to change your treatment, we will call you to review the results.   Testing/Procedures: Your physician has recommended that you wear a Zio monitor.   This monitor is a medical device that records the heart's electrical activity. Doctors most often use these monitors to diagnose arrhythmias. Arrhythmias are problems with the speed or rhythm of the heartbeat. The monitor is a small device applied to your chest. You can wear one while you do your normal daily activities. While wearing this monitor if you have any symptoms to push the button and record what you felt. Once you have worn this monitor for the period of time provider prescribed (Usually 14 days), you will return the monitor device in the postage paid box. Once it is returned they will download the data collected and provide [place] with a report which the provider will then review and we will call you with those results. Important tips:  Avoid showering during the first 24 hours of wearing the monitor. Avoid excessive sweating to help maximize wear time. Do not submerge the device, no hot tubs, and no swimming pools. Keep any lotions or oils away from the patch. After 24 hours you may shower with the patch on. Take brief showers with your back facing the shower head.  Do not remove patch once it has been placed because that will interrupt data and decrease adhesive wear time. Push the button when you have any symptoms and write down what you were feeling. Once you have completed wearing your monitor, remove and place into box  which has postage paid and place in your outgoing mailbox.  If for some reason you have misplaced your box then call our office and we can provide another box and/or mail it off for you.      Follow-Up: At [facility], you and your health needs are our priority.  As part of our continuing mission to provide you with exceptional heart care, we have created designated Provider Care Teams.  These Care Teams include your primary Cardiologist (physician) and Advanced Practice Providers (APPs -  Physician Assistants and Nurse Practitioners) who all work together to provide you with the care you need, when you need it.    Your next appointment:   1 month(s)  Provider:   You will see one of the following Advanced Practice Providers on your designated Care Team:   Francis Dowse, Charlott Holler [address]" [place], [place] Sherie Don, NP Canary Brim, NP

## 2023-11-08 ENCOUNTER — Ambulatory Visit: Payer: BC Managed Care – PPO | Attending: Cardiology

## 2023-11-08 DIAGNOSIS — I480 Paroxysmal atrial fibrillation: Secondary | ICD-10-CM

## 2023-11-08 DIAGNOSIS — D6869 Other thrombophilia: Secondary | ICD-10-CM

## 2023-11-16 DIAGNOSIS — I480 Paroxysmal atrial fibrillation: Secondary | ICD-10-CM

## 2023-11-16 DIAGNOSIS — D6869 Other thrombophilia: Secondary | ICD-10-CM

## 2023-11-21 ENCOUNTER — Encounter (HOSPITAL_BASED_OUTPATIENT_CLINIC_OR_DEPARTMENT_OTHER): Payer: BC Managed Care – PPO | Admitting: Physical Therapy

## 2023-12-02 ENCOUNTER — Ambulatory Visit: Payer: BC Managed Care – PPO | Admitting: Cardiology

## 2023-12-10 NOTE — Progress Notes (Signed)
Cardiology Office Note:  .   Date:  12/10/2023  ID:  Fenton Foy, DOB 10/28/1961, MRN [id number] PCP: Charlane Ferretti, DO  [facility] Providers Cardiologist:  Dr. Clifton James Electrophysiologist:  Lanier Prude, MD {  History of Present Illness: .   Takuya Topf is a 62 y.o. male w/PMHx of GERD as well as hx of esophageal dilation, VHD (AI), Afib, aortic atherosclerosis on CT  Saw Dr. Clifton James Jan 2023 for his mod AI, planned to repeat echo in the summer. No symptoms or further watch detections of AFib  Saw Dr. Lalla Brothers 02/11/22, reported increasing frequency and duration of his Afib, symptomatic. Lightheaded, dizzy with subsequent extreme fatigue Using metoprolol with reports of HR 40's via his watch after taking the BB Planned to pursue ablation, started on eliquis  At his 3 mo post procedure visit, continued to have some Afib though fewer and shorter Resting HRs 40's Planned to keep him on [facility] another 6 mo if no AFib would consider stopping >> to ASA  Saw S. Riddle, NP 11/07/23, recurrent palpitations though different then his pre-ablation AFib symptoms, his watch reports AFib, in her reviw of the tracings from his watch, artifact made identification of the rhythm uncertain. On ASA, planned for monitoring and f/u The pt preferring to pursue re-do ablation and management strategy  Today's visit is scheduled as an annual visit >> though needs to follow up on recent AFib burden and monitoring  Monitor reviewed by myself (not yet officially read) Avg HR 68 Min 42 Occ 4.6% PACs No AFib Brief PAT/SVTs Symptoms associated with SR/PACs, and near timing of some/not all SVTs  ROS:   When not having palpitations he feels well No CP, SOB, DOE, No near syncope or syncope.  He has noted that stress is a trigger for him (work), and times of being dehydrated as well.  His palpitations are intermittent/brief, but recurrent. Frequency can be several in a day and days with  none Mor days with none then has but on the days he feels them thye leave him fatigued/tired  PRN metoprolol does help   Arrhythmia/AAD hx Found vis his watch Nov 2022 PVI ablation 04/26/22  Studies Reviewed: Marland Kitchen    EKG not done today   TTE, 04/01/2023  1. Left ventricular ejection fraction, by estimation, is 55 to 60%. Left ventricular ejection fraction by 3D volume is 54 %. The left ventricle has normal function. The left ventricle has no regional wall motion abnormalities. Left ventricular diastolic  parameters were normal.   2. Right ventricular systolic function is normal. The right ventricular size is normal. Tricuspid regurgitation signal is inadequate for assessing PA pressure.   3. Borderline mitral valve prolapse of the posterior leaflet. The mitral valve is grossly normal. Mild mitral valve regurgitation. No evidence of mitral stenosis.   4. The aortic valve is tricuspid. There is mild calcification of the aortic valve. There is moderate thickening of the aortic valve. Aortic valve regurgitation is mild to moderate. Aortic valve sclerosis/calcification is present, without any evidence of  aortic stenosis.   5. Ascending aorta measurements are within normal limits for age when indexed to body surface area.   6. The inferior vena cava is normal in size with greater than 50% respiratory variability, suggesting right atrial pressure of 3 mmHg.    04/26/22: EPS/ablation CONCLUSIONS: 1. Successful PVI 2. Successful ablation of the cavotricuspid isthmus for typical atrial flutter 3. Intracardiac echo reveals trivial pericardial effusion, normal left atrial architecture 4. No  early apparent complications. 5. Colchicine 0.6mg  PO BID x 5 days 6. Protonix 40mg  PO daily x 45 days     Cardiac CT, 04/19/2022 1. There is normal pulmonary vein drainage into the left atrium with ostial measurements above.  2. There is no thrombus in the left atrial appendage.  3. The esophagus runs in the left  atrial midline and is not in proximity to any of the pulmonary vein ostia.  4. Small PFO.  5. Normal coronary origin. Right dominance.  6. CAC score of 28 which is 55 percentile for age-, race-, and sex-matched controls.   Risk Assessment/Calculations:    Physical Exam:   VS:  There were no vitals taken for this visit.   Wt Readings from Last 3 Encounters:  11/07/23 193 lb 6 oz (87.7 kg)  07/31/22 195 lb 3.2 oz (88.5 kg)  05/24/22 200 lb 12.8 oz (91.1 kg)    GEN: Well nourished, well developed in no acute distress NECK: No JVD; No carotid bruits CARDIAC: RRR, no murmurs, rubs, gallops RESPIRATORY:  CTA b/l without rales, wheezing or rhonchi  ABDOMEN: Soft, non-tender, non-distended EXTREMITIES: No edema; No deformity    ASSESSMENT AND PLAN: .    Paroxysmal  AFib CHA2DS2Vasc is 1 (with report of aortic atherosclerosis, by his pre-ablation CT his Aorta: Normal caliber with no significant disease.) Not on OAC brief SVTs on his monitor at time PACs are bigeminal  (His watch reported AFib on a few occasions during the monitoring period) No actual AFib noted, suspect the PATs/ectopy were/are driving his watch alerts  His observation of HRs 40s are with sleep (as noted on his monitor as well) Avg HR 60's, so I think we have room for daily low dose nodal blocker if needed Introduced Tikosyn as an option perhaps, though a few days in the hospital less then ideal.  Advised that he try and avoid the triggers (work stress may be hard to change) but urged he stay well hydrated Continue to use PRN lopressor (taking 12.5mg  as dose) Q4hours PRN, 3 doses in a day advised  Looks like an Atach, uncertain the brevity of them will be able to map/ablate Though have reached out to Dr. Lalla Brothers for his recommendations as well   VHD Mild-mod AI by his last echo C/w Dr. McAlhany/team     Dispo: back in 2-3 mo, monitor burden, sooner if needed  Signed, Sheilah Pigeon, PA-C

## 2023-12-12 ENCOUNTER — Ambulatory Visit: Payer: BC Managed Care – PPO | Attending: Physician Assistant | Admitting: Physician Assistant

## 2023-12-12 VITALS — BP 118/72 | HR 60 | Ht 78.0 in | Wt 198.0 lb

## 2023-12-12 DIAGNOSIS — R002 Palpitations: Secondary | ICD-10-CM

## 2023-12-12 DIAGNOSIS — I4719 Other supraventricular tachycardia: Secondary | ICD-10-CM | POA: Diagnosis not present

## 2023-12-12 DIAGNOSIS — I480 Paroxysmal atrial fibrillation: Secondary | ICD-10-CM

## 2023-12-12 NOTE — Patient Instructions (Addendum)
Medication Instructions:   Your physician recommends that you continue on your current medications as directed. Please refer to the Current Medication list given to you today.   *If you need a refill on your cardiac medications before your next appointment, please call your pharmacy*    Lab Work: NONE ORDERED  TODAY   If you have labs (blood work) drawn today and your tests are completely normal, you will receive your results only by: MyChart Message (if you have MyChart) OR A paper copy in the mail If you have any lab test that is abnormal or we need to change your treatment, we will call you to review the results.   Testing/Procedures: NONE ORDERED  TODAY      Follow-Up: At [facility], you and your health needs are our priority.  As part of our continuing mission to provide you with exceptional heart care, we have created designated Provider Care Teams.  These Care Teams include your primary Cardiologist (physician) and Advanced Practice Providers (APPs -  Physician Assistants and Nurse Practitioners) who all work together to provide you with the care you need, when you need it.  We recommend signing up for the patient portal called "MyChart".  Sign up information is provided on this After Visit Summary.  MyChart is used to connect with patients for Virtual Visits (Telemedicine).  Patients are able to view lab/test results, encounter notes, upcoming appointments, etc.  Non-urgent messages can be sent to your provider as well.   To learn more about what you can do with MyChart, go to [email or web address].    Your next appointment:     2 -3 month(s) ( CONTACT  CASSIE HALL/ ANGELINE HAMMER FOR EP SCHEDULING ISSUES )   Provider:     Steffanie Dunn, MD    Other Instructions

## 2024-03-16 NOTE — Progress Notes (Unsigned)
  Electrophysiology Office Follow up Visit Note:    Date:  03/17/2024   ID:  Joseph Brennan, DOB May 17, 1961, MRN [id number]  PCP:  Charlane Ferretti, DO  [facility] Cardiologist:  None  [facility] Electrophysiologist:  Lanier Prude, MD    Interval History:     Joseph Brennan is a 63 y.o. male who presents for a follow up visit.   The patient last saw [facility] December 12, 2023.  He has a history of atrial fibrillation, aortic insufficiency, GERD.  He has a history of atrial fibrillation ablation Apr 26, 2022.  During that procedure, the pulmonary veins and CTI were ablated. Today he is with his wife in clinic. He continues to exercise regularly. Feels palpitations 1 week out of the month. No "full" episodes of AF like before the ablation but with some irregular beats. When he takes a lopressor, the sensation calms down significantly. No syncope/presyncope.       Past medical, surgical, social and family history were reviewed.  ROS:   Please see the history of present illness.    All other systems reviewed and are negative.  EKGs/Labs/Other Studies Reviewed:    The following studies were reviewed today:  December 13, 2023 ZIO monitor personally reviewed No atrial fibrillation No sustained arrhythmias Occasional supraventricular ectopy, 4.6%        Physical Exam:    VS:  BP 116/62   Pulse 67   Ht 6\' 6"  (1.981 m)   Wt 199 lb (90.3 kg)   SpO2 98%   BMI 23.00 kg/m     Wt Readings from Last 3 Encounters:  03/17/24 199 lb (90.3 kg)  12/12/23 198 lb (89.8 kg)  11/07/23 193 lb 6 oz (87.7 kg)     GEN: no distress CARD: RRR, No MRG RESP: No IWOB. CTAB.      ASSESSMENT:    1. Paroxysmal atrial fibrillation (HCC)   2. Atrial tachycardia (HCC)   3. Palpitations    PLAN:    In order of problems listed above:  #Paroxysmal atrial fibrillation and flutter Prior catheter ablation in 2023. On aspirin 81 mg by mouth once daily On metoprolol tartrate 25 mg by  mouth as needed Add metoprolol succinate 25mg  PO daily   Plan for EP APP follow up in 6 months.      Signed, Steffanie Dunn, MD, [facility], [facility] 03/17/2024 2:09 PM    Electrophysiology [facility]

## 2024-03-17 ENCOUNTER — Other Ambulatory Visit (HOSPITAL_BASED_OUTPATIENT_CLINIC_OR_DEPARTMENT_OTHER): Payer: Self-pay

## 2024-03-17 ENCOUNTER — Ambulatory Visit: Payer: BC Managed Care – PPO | Attending: Cardiology | Admitting: Cardiology

## 2024-03-17 ENCOUNTER — Encounter: Payer: Self-pay | Admitting: Cardiology

## 2024-03-17 VITALS — BP 116/62 | HR 67 | Ht 78.0 in | Wt 199.0 lb

## 2024-03-17 DIAGNOSIS — R002 Palpitations: Secondary | ICD-10-CM | POA: Diagnosis not present

## 2024-03-17 DIAGNOSIS — I480 Paroxysmal atrial fibrillation: Secondary | ICD-10-CM | POA: Diagnosis not present

## 2024-03-17 DIAGNOSIS — I4719 Other supraventricular tachycardia: Secondary | ICD-10-CM | POA: Diagnosis not present

## 2024-03-17 MED ORDER — METOPROLOL SUCCINATE ER 25 MG PO TB24
25.0000 mg | ORAL_TABLET | Freq: Every day | ORAL | 3 refills | Status: DC
  Filled 2024-03-17: qty 30, 30d supply, fill #0

## 2024-03-17 NOTE — Patient Instructions (Signed)
 Medication Instructions:  Your physician has recommended you make the following change in your medication:  1) START taking Toprol XL (metoprolol succinate) 25 mg daily *If you need a refill on your cardiac medications before your next appointment, please call your pharmacy*  Follow-Up: At [facility], you and your health needs are our priority.  As part of our continuing mission to provide you with exceptional heart care, our providers are all part of one team.  This team includes your primary Cardiologist (physician) and Advanced Practice Providers or APPs (Physician Assistants and Nurse Practitioners) who all work together to provide you with the care you need, when you need it.  Your next appointment:   6 months  Provider:   You will see one of the following Advanced Practice Providers on your designated Care Team:   Francis Dowse, [place] Casimiro Needle "Mardelle Matte" [place], PA-C Sherie Don, NP Canary Brim, NP      1st Floor: - Lobby - Registration  - Pharmacy  - Lab - Cafe  2nd Floor: - PV Lab - Diagnostic Testing (echo, CT, nuclear med)  3rd Floor: - Vacant  4th Floor: - TCTS (cardiothoracic surgery) - AFib Clinic - Structural Heart Clinic - Vascular Surgery  - Vascular Ultrasound  5th Floor: - HeartCare Cardiology (general and EP) - Clinical Pharmacy for coumadin, hypertension, lipid, weight-loss medications, and med management appointments    Valet parking services will be available as well.

## 2024-04-14 ENCOUNTER — Other Ambulatory Visit: Payer: Self-pay

## 2024-04-14 MED ORDER — METOPROLOL SUCCINATE ER 25 MG PO TB24
25.0000 mg | ORAL_TABLET | Freq: Every day | ORAL | 3 refills | Status: AC
Start: 2024-04-14 — End: ?

## 2024-04-19 ENCOUNTER — Ambulatory Visit (INDEPENDENT_AMBULATORY_CARE_PROVIDER_SITE_OTHER): Payer: BC Managed Care – PPO

## 2024-05-27 DIAGNOSIS — D6869 Other thrombophilia: Secondary | ICD-10-CM | POA: Diagnosis not present

## 2024-05-27 DIAGNOSIS — R3121 Asymptomatic microscopic hematuria: Secondary | ICD-10-CM | POA: Diagnosis not present

## 2024-06-03 DIAGNOSIS — Z1331 Encounter for screening for depression: Secondary | ICD-10-CM | POA: Diagnosis not present

## 2024-06-03 DIAGNOSIS — Z0000 Encounter for general adult medical examination without abnormal findings: Secondary | ICD-10-CM | POA: Diagnosis not present

## 2024-06-03 DIAGNOSIS — Z1389 Encounter for screening for other disorder: Secondary | ICD-10-CM | POA: Diagnosis not present

## 2024-06-03 DIAGNOSIS — I4891 Unspecified atrial fibrillation: Secondary | ICD-10-CM | POA: Diagnosis not present

## 2024-07-07 DIAGNOSIS — M9905 Segmental and somatic dysfunction of pelvic region: Secondary | ICD-10-CM | POA: Diagnosis not present

## 2024-07-07 DIAGNOSIS — M9901 Segmental and somatic dysfunction of cervical region: Secondary | ICD-10-CM | POA: Diagnosis not present

## 2024-07-07 DIAGNOSIS — M9904 Segmental and somatic dysfunction of sacral region: Secondary | ICD-10-CM | POA: Diagnosis not present

## 2024-07-07 DIAGNOSIS — M545 Low back pain, unspecified: Secondary | ICD-10-CM | POA: Diagnosis not present

## 2024-07-07 DIAGNOSIS — M9903 Segmental and somatic dysfunction of lumbar region: Secondary | ICD-10-CM | POA: Diagnosis not present

## 2024-07-09 DIAGNOSIS — M9905 Segmental and somatic dysfunction of pelvic region: Secondary | ICD-10-CM | POA: Diagnosis not present

## 2024-07-09 DIAGNOSIS — M9903 Segmental and somatic dysfunction of lumbar region: Secondary | ICD-10-CM | POA: Diagnosis not present

## 2024-07-09 DIAGNOSIS — M9904 Segmental and somatic dysfunction of sacral region: Secondary | ICD-10-CM | POA: Diagnosis not present

## 2024-07-09 DIAGNOSIS — M545 Low back pain, unspecified: Secondary | ICD-10-CM | POA: Diagnosis not present

## 2024-07-12 DIAGNOSIS — M9905 Segmental and somatic dysfunction of pelvic region: Secondary | ICD-10-CM | POA: Diagnosis not present

## 2024-07-12 DIAGNOSIS — M545 Low back pain, unspecified: Secondary | ICD-10-CM | POA: Diagnosis not present

## 2024-07-12 DIAGNOSIS — M9903 Segmental and somatic dysfunction of lumbar region: Secondary | ICD-10-CM | POA: Diagnosis not present

## 2024-07-12 DIAGNOSIS — M9904 Segmental and somatic dysfunction of sacral region: Secondary | ICD-10-CM | POA: Diagnosis not present

## 2024-07-13 DIAGNOSIS — M545 Low back pain, unspecified: Secondary | ICD-10-CM | POA: Diagnosis not present

## 2024-07-13 DIAGNOSIS — M9903 Segmental and somatic dysfunction of lumbar region: Secondary | ICD-10-CM | POA: Diagnosis not present

## 2024-07-13 DIAGNOSIS — M9904 Segmental and somatic dysfunction of sacral region: Secondary | ICD-10-CM | POA: Diagnosis not present

## 2024-07-13 DIAGNOSIS — M9905 Segmental and somatic dysfunction of pelvic region: Secondary | ICD-10-CM | POA: Diagnosis not present

## 2024-07-15 DIAGNOSIS — M9905 Segmental and somatic dysfunction of pelvic region: Secondary | ICD-10-CM | POA: Diagnosis not present

## 2024-07-15 DIAGNOSIS — M545 Low back pain, unspecified: Secondary | ICD-10-CM | POA: Diagnosis not present

## 2024-07-15 DIAGNOSIS — M9903 Segmental and somatic dysfunction of lumbar region: Secondary | ICD-10-CM | POA: Diagnosis not present

## 2024-07-15 DIAGNOSIS — M9904 Segmental and somatic dysfunction of sacral region: Secondary | ICD-10-CM | POA: Diagnosis not present

## 2024-07-19 DIAGNOSIS — M9905 Segmental and somatic dysfunction of pelvic region: Secondary | ICD-10-CM | POA: Diagnosis not present

## 2024-07-19 DIAGNOSIS — M9904 Segmental and somatic dysfunction of sacral region: Secondary | ICD-10-CM | POA: Diagnosis not present

## 2024-07-19 DIAGNOSIS — M9903 Segmental and somatic dysfunction of lumbar region: Secondary | ICD-10-CM | POA: Diagnosis not present

## 2024-07-19 DIAGNOSIS — M545 Low back pain, unspecified: Secondary | ICD-10-CM | POA: Diagnosis not present

## 2024-07-20 DIAGNOSIS — M545 Low back pain, unspecified: Secondary | ICD-10-CM | POA: Diagnosis not present

## 2024-07-20 DIAGNOSIS — M9905 Segmental and somatic dysfunction of pelvic region: Secondary | ICD-10-CM | POA: Diagnosis not present

## 2024-07-20 DIAGNOSIS — M9904 Segmental and somatic dysfunction of sacral region: Secondary | ICD-10-CM | POA: Diagnosis not present

## 2024-07-20 DIAGNOSIS — M9903 Segmental and somatic dysfunction of lumbar region: Secondary | ICD-10-CM | POA: Diagnosis not present

## 2024-07-22 DIAGNOSIS — M9905 Segmental and somatic dysfunction of pelvic region: Secondary | ICD-10-CM | POA: Diagnosis not present

## 2024-07-22 DIAGNOSIS — M9903 Segmental and somatic dysfunction of lumbar region: Secondary | ICD-10-CM | POA: Diagnosis not present

## 2024-07-22 DIAGNOSIS — M545 Low back pain, unspecified: Secondary | ICD-10-CM | POA: Diagnosis not present

## 2024-07-22 DIAGNOSIS — M9904 Segmental and somatic dysfunction of sacral region: Secondary | ICD-10-CM | POA: Diagnosis not present

## 2024-07-26 DIAGNOSIS — M9903 Segmental and somatic dysfunction of lumbar region: Secondary | ICD-10-CM | POA: Diagnosis not present

## 2024-07-26 DIAGNOSIS — M9904 Segmental and somatic dysfunction of sacral region: Secondary | ICD-10-CM | POA: Diagnosis not present

## 2024-07-26 DIAGNOSIS — M9905 Segmental and somatic dysfunction of pelvic region: Secondary | ICD-10-CM | POA: Diagnosis not present

## 2024-07-26 DIAGNOSIS — M545 Low back pain, unspecified: Secondary | ICD-10-CM | POA: Diagnosis not present

## 2024-07-27 DIAGNOSIS — M9903 Segmental and somatic dysfunction of lumbar region: Secondary | ICD-10-CM | POA: Diagnosis not present

## 2024-07-27 DIAGNOSIS — M9904 Segmental and somatic dysfunction of sacral region: Secondary | ICD-10-CM | POA: Diagnosis not present

## 2024-07-27 DIAGNOSIS — M9905 Segmental and somatic dysfunction of pelvic region: Secondary | ICD-10-CM | POA: Diagnosis not present

## 2024-07-27 DIAGNOSIS — M545 Low back pain, unspecified: Secondary | ICD-10-CM | POA: Diagnosis not present

## 2024-08-03 DIAGNOSIS — M9905 Segmental and somatic dysfunction of pelvic region: Secondary | ICD-10-CM | POA: Diagnosis not present

## 2024-08-03 DIAGNOSIS — M545 Low back pain, unspecified: Secondary | ICD-10-CM | POA: Diagnosis not present

## 2024-08-03 DIAGNOSIS — M9904 Segmental and somatic dysfunction of sacral region: Secondary | ICD-10-CM | POA: Diagnosis not present

## 2024-08-03 DIAGNOSIS — M9903 Segmental and somatic dysfunction of lumbar region: Secondary | ICD-10-CM | POA: Diagnosis not present

## 2024-08-05 DIAGNOSIS — M9905 Segmental and somatic dysfunction of pelvic region: Secondary | ICD-10-CM | POA: Diagnosis not present

## 2024-08-05 DIAGNOSIS — M9904 Segmental and somatic dysfunction of sacral region: Secondary | ICD-10-CM | POA: Diagnosis not present

## 2024-08-05 DIAGNOSIS — M545 Low back pain, unspecified: Secondary | ICD-10-CM | POA: Diagnosis not present

## 2024-08-05 DIAGNOSIS — M9903 Segmental and somatic dysfunction of lumbar region: Secondary | ICD-10-CM | POA: Diagnosis not present

## 2024-08-10 DIAGNOSIS — M9905 Segmental and somatic dysfunction of pelvic region: Secondary | ICD-10-CM | POA: Diagnosis not present

## 2024-08-10 DIAGNOSIS — M9903 Segmental and somatic dysfunction of lumbar region: Secondary | ICD-10-CM | POA: Diagnosis not present

## 2024-08-10 DIAGNOSIS — M9904 Segmental and somatic dysfunction of sacral region: Secondary | ICD-10-CM | POA: Diagnosis not present

## 2024-08-10 DIAGNOSIS — M545 Low back pain, unspecified: Secondary | ICD-10-CM | POA: Diagnosis not present

## 2024-08-12 DIAGNOSIS — M9905 Segmental and somatic dysfunction of pelvic region: Secondary | ICD-10-CM | POA: Diagnosis not present

## 2024-08-12 DIAGNOSIS — M9904 Segmental and somatic dysfunction of sacral region: Secondary | ICD-10-CM | POA: Diagnosis not present

## 2024-08-12 DIAGNOSIS — M9903 Segmental and somatic dysfunction of lumbar region: Secondary | ICD-10-CM | POA: Diagnosis not present

## 2024-08-12 DIAGNOSIS — M545 Low back pain, unspecified: Secondary | ICD-10-CM | POA: Diagnosis not present

## 2024-08-17 DIAGNOSIS — M545 Low back pain, unspecified: Secondary | ICD-10-CM | POA: Diagnosis not present

## 2024-08-17 DIAGNOSIS — M9905 Segmental and somatic dysfunction of pelvic region: Secondary | ICD-10-CM | POA: Diagnosis not present

## 2024-08-17 DIAGNOSIS — M9903 Segmental and somatic dysfunction of lumbar region: Secondary | ICD-10-CM | POA: Diagnosis not present

## 2024-08-17 DIAGNOSIS — M9904 Segmental and somatic dysfunction of sacral region: Secondary | ICD-10-CM | POA: Diagnosis not present

## 2024-08-19 DIAGNOSIS — M9904 Segmental and somatic dysfunction of sacral region: Secondary | ICD-10-CM | POA: Diagnosis not present

## 2024-08-19 DIAGNOSIS — M9903 Segmental and somatic dysfunction of lumbar region: Secondary | ICD-10-CM | POA: Diagnosis not present

## 2024-08-19 DIAGNOSIS — M545 Low back pain, unspecified: Secondary | ICD-10-CM | POA: Diagnosis not present

## 2024-08-19 DIAGNOSIS — M9905 Segmental and somatic dysfunction of pelvic region: Secondary | ICD-10-CM | POA: Diagnosis not present

## 2024-08-24 DIAGNOSIS — M9904 Segmental and somatic dysfunction of sacral region: Secondary | ICD-10-CM | POA: Diagnosis not present

## 2024-08-24 DIAGNOSIS — M545 Low back pain, unspecified: Secondary | ICD-10-CM | POA: Diagnosis not present

## 2024-08-24 DIAGNOSIS — M9905 Segmental and somatic dysfunction of pelvic region: Secondary | ICD-10-CM | POA: Diagnosis not present

## 2024-08-24 DIAGNOSIS — M9903 Segmental and somatic dysfunction of lumbar region: Secondary | ICD-10-CM | POA: Diagnosis not present

## 2024-08-31 DIAGNOSIS — M9903 Segmental and somatic dysfunction of lumbar region: Secondary | ICD-10-CM | POA: Diagnosis not present

## 2024-08-31 DIAGNOSIS — M9905 Segmental and somatic dysfunction of pelvic region: Secondary | ICD-10-CM | POA: Diagnosis not present

## 2024-08-31 DIAGNOSIS — M545 Low back pain, unspecified: Secondary | ICD-10-CM | POA: Diagnosis not present

## 2024-08-31 DIAGNOSIS — M9904 Segmental and somatic dysfunction of sacral region: Secondary | ICD-10-CM | POA: Diagnosis not present

## 2024-09-02 DIAGNOSIS — M9903 Segmental and somatic dysfunction of lumbar region: Secondary | ICD-10-CM | POA: Diagnosis not present

## 2024-09-02 DIAGNOSIS — M9904 Segmental and somatic dysfunction of sacral region: Secondary | ICD-10-CM | POA: Diagnosis not present

## 2024-09-02 DIAGNOSIS — M9905 Segmental and somatic dysfunction of pelvic region: Secondary | ICD-10-CM | POA: Diagnosis not present

## 2024-09-02 DIAGNOSIS — M545 Low back pain, unspecified: Secondary | ICD-10-CM | POA: Diagnosis not present

## 2024-09-07 DIAGNOSIS — M9905 Segmental and somatic dysfunction of pelvic region: Secondary | ICD-10-CM | POA: Diagnosis not present

## 2024-09-07 DIAGNOSIS — M9904 Segmental and somatic dysfunction of sacral region: Secondary | ICD-10-CM | POA: Diagnosis not present

## 2024-09-07 DIAGNOSIS — M9903 Segmental and somatic dysfunction of lumbar region: Secondary | ICD-10-CM | POA: Diagnosis not present

## 2024-09-07 DIAGNOSIS — M545 Low back pain, unspecified: Secondary | ICD-10-CM | POA: Diagnosis not present

## 2024-09-09 DIAGNOSIS — M9903 Segmental and somatic dysfunction of lumbar region: Secondary | ICD-10-CM | POA: Diagnosis not present

## 2024-09-09 DIAGNOSIS — M9904 Segmental and somatic dysfunction of sacral region: Secondary | ICD-10-CM | POA: Diagnosis not present

## 2024-09-09 DIAGNOSIS — M9905 Segmental and somatic dysfunction of pelvic region: Secondary | ICD-10-CM | POA: Diagnosis not present

## 2024-09-09 DIAGNOSIS — M545 Low back pain, unspecified: Secondary | ICD-10-CM | POA: Diagnosis not present

## 2024-09-21 DIAGNOSIS — M9905 Segmental and somatic dysfunction of pelvic region: Secondary | ICD-10-CM | POA: Diagnosis not present

## 2024-09-21 DIAGNOSIS — M545 Low back pain, unspecified: Secondary | ICD-10-CM | POA: Diagnosis not present

## 2024-09-21 DIAGNOSIS — M9904 Segmental and somatic dysfunction of sacral region: Secondary | ICD-10-CM | POA: Diagnosis not present

## 2024-09-21 DIAGNOSIS — M9903 Segmental and somatic dysfunction of lumbar region: Secondary | ICD-10-CM | POA: Diagnosis not present

## 2024-09-27 NOTE — Progress Notes (Unsigned)
  Electrophysiology Office Follow up Visit Note:    Date:  09/28/2024   ID:  Joseph Brennan, DOB 01/05/1961, MRN [id number]  PCP:  Valentin Skates, DO  [facility] Cardiologist:  None  [facility] Electrophysiologist:  OLE ONEIDA HOLTS, MD    Interval History:     Joseph Brennan is a 63 y.o. male who presents for a follow up visit.   I last saw the patient March 17, 2024.  He has a history of atrial fibrillation flutter with a prior catheter ablation in 2023.  He takes metoprolol  and aspirin .  At the last appointment we planned for a 43-month follow-up appointment.  He is doing really well.  No breakthrough episodes of atrial fibrillation.  He takes metoprolol  succinate 12.5 mg by mouth once daily.      Past medical, surgical, social and family history were reviewed.  ROS:   Please see the history of present illness.    All other systems reviewed and are negative.  EKGs/Labs/Other Studies Reviewed:    The following studies were reviewed today:          Physical Exam:    VS:  BP 114/62 (BP Location: Left Arm, Patient Position: Sitting, Cuff Size: Normal)   Pulse 65   Ht 6' 6 (1.981 m)   Wt 196 lb (88.9 kg)   SpO2 96%   BMI 22.65 kg/m     Wt Readings from Last 3 Encounters:  09/28/24 196 lb (88.9 kg)  03/17/24 199 lb (90.3 kg)  12/12/23 198 lb (89.8 kg)     GEN: no distress CARD: RRR, No MRG RESP: No IWOB. CTAB.      ASSESSMENT:    1. Paroxysmal atrial fibrillation (HCC)    PLAN:    In order of problems listed above:  #Atrial fibrillation and flutter Prior catheter ablation in 2023 On aspirin  once daily On metoprolol  succinate daily with breakthrough metoprolol  tartrate available  I discussed my upcoming departure from [facility] during today's clinic appointment.  Follow-up as needed with Dr. Kennyth.   Signed, OLE HOLTS, MD, [facility], [facility] 09/28/2024 10:44 AM    Electrophysiology [facility]

## 2024-09-28 ENCOUNTER — Ambulatory Visit: Attending: Cardiology | Admitting: Cardiology

## 2024-09-28 ENCOUNTER — Encounter: Payer: Self-pay | Admitting: Cardiology

## 2024-09-28 VITALS — BP 114/62 | HR 65 | Ht 78.0 in | Wt 196.0 lb

## 2024-09-28 DIAGNOSIS — M545 Low back pain, unspecified: Secondary | ICD-10-CM | POA: Diagnosis not present

## 2024-09-28 DIAGNOSIS — M9904 Segmental and somatic dysfunction of sacral region: Secondary | ICD-10-CM | POA: Diagnosis not present

## 2024-09-28 DIAGNOSIS — M9905 Segmental and somatic dysfunction of pelvic region: Secondary | ICD-10-CM | POA: Diagnosis not present

## 2024-09-28 DIAGNOSIS — M9903 Segmental and somatic dysfunction of lumbar region: Secondary | ICD-10-CM | POA: Diagnosis not present

## 2024-09-28 DIAGNOSIS — I480 Paroxysmal atrial fibrillation: Secondary | ICD-10-CM

## 2024-09-28 NOTE — Patient Instructions (Signed)

## 2024-10-05 DIAGNOSIS — M9904 Segmental and somatic dysfunction of sacral region: Secondary | ICD-10-CM | POA: Diagnosis not present

## 2024-10-05 DIAGNOSIS — M9905 Segmental and somatic dysfunction of pelvic region: Secondary | ICD-10-CM | POA: Diagnosis not present

## 2024-10-05 DIAGNOSIS — M545 Low back pain, unspecified: Secondary | ICD-10-CM | POA: Diagnosis not present

## 2024-10-05 DIAGNOSIS — M9903 Segmental and somatic dysfunction of lumbar region: Secondary | ICD-10-CM | POA: Diagnosis not present

## 2024-10-19 DIAGNOSIS — M9904 Segmental and somatic dysfunction of sacral region: Secondary | ICD-10-CM | POA: Diagnosis not present

## 2024-10-19 DIAGNOSIS — M545 Low back pain, unspecified: Secondary | ICD-10-CM | POA: Diagnosis not present

## 2024-10-19 DIAGNOSIS — M9903 Segmental and somatic dysfunction of lumbar region: Secondary | ICD-10-CM | POA: Diagnosis not present

## 2024-10-19 DIAGNOSIS — M9905 Segmental and somatic dysfunction of pelvic region: Secondary | ICD-10-CM | POA: Diagnosis not present

## 2024-11-24 DIAGNOSIS — L570 Actinic keratosis: Secondary | ICD-10-CM | POA: Diagnosis not present

## 2024-11-24 DIAGNOSIS — D225 Melanocytic nevi of trunk: Secondary | ICD-10-CM | POA: Diagnosis not present
